# Patient Record
Sex: Female | Born: 1990 | Race: Black or African American | Hispanic: No | Smoking: Never smoker
Health system: Southern US, Community
[De-identification: ages and names within clinical notes are randomized; demographics above are authoritative.]

## PROBLEM LIST (undated history)

## (undated) DIAGNOSIS — N760 Acute vaginitis: Secondary | ICD-10-CM

## (undated) DIAGNOSIS — B373 Candidiasis of vulva and vagina: Secondary | ICD-10-CM

## (undated) DIAGNOSIS — B379 Candidiasis, unspecified: Secondary | ICD-10-CM

## (undated) DIAGNOSIS — R51 Headache: Secondary | ICD-10-CM

## (undated) DIAGNOSIS — N949 Unspecified condition associated with female genital organs and menstrual cycle: Secondary | ICD-10-CM

## (undated) DIAGNOSIS — A6000 Herpesviral infection of urogenital system, unspecified: Secondary | ICD-10-CM

## (undated) DIAGNOSIS — J45909 Unspecified asthma, uncomplicated: Secondary | ICD-10-CM

## (undated) DIAGNOSIS — B9689 Other specified bacterial agents as the cause of diseases classified elsewhere: Secondary | ICD-10-CM

## (undated) HISTORY — PX: NO PAST SURGERIES: SHX2092

## (undated) HISTORY — DX: Unspecified asthma, uncomplicated: J45.909

---
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
## 2009-03-30 ENCOUNTER — Emergency Department (HOSPITAL_COMMUNITY): Admission: EM | Admit: 2009-03-30 | Discharge: 2009-03-30 | Payer: Self-pay | Admitting: Emergency Medicine

## 2009-10-31 ENCOUNTER — Emergency Department (HOSPITAL_COMMUNITY): Admission: EM | Admit: 2009-10-31 | Discharge: 2009-11-01 | Payer: Self-pay | Admitting: Emergency Medicine

## 2010-11-26 ENCOUNTER — Inpatient Hospital Stay (INDEPENDENT_AMBULATORY_CARE_PROVIDER_SITE_OTHER)
Admission: RE | Admit: 2010-11-26 | Discharge: 2010-11-26 | Disposition: A | Discharge status: Home / Self Care | Payer: Self-pay | Source: Ambulatory Visit | Attending: Family Medicine | Admitting: Family Medicine

## 2010-11-26 DIAGNOSIS — H109 Unspecified conjunctivitis: Secondary | ICD-10-CM

## 2011-02-15 LAB — I-STAT 8, (EC8 V) (CONVERTED LAB)
Acid-base deficit: 2
BUN: 4 — ABNORMAL LOW
Bicarbonate: 23.1
HCT: 42
Hemoglobin: 14.3
Operator id: 288831
Sodium: 134 — ABNORMAL LOW
TCO2: 24
pCO2, Ven: 41.6 — ABNORMAL LOW

## 2011-02-15 LAB — URINALYSIS, ROUTINE W REFLEX MICROSCOPIC
Hgb urine dipstick: NEGATIVE
Specific Gravity, Urine: 1.025
Urobilinogen, UA: 1

## 2011-02-23 LAB — CBC
HCT: 33.5 — ABNORMAL LOW
Hemoglobin: 10.9 — ABNORMAL LOW
MCHC: 32.4
MCHC: 32.4
MCV: 73.2 — ABNORMAL LOW
MCV: 73.8 — ABNORMAL LOW
Platelets: 174
RDW: 14.9
RDW: 15.1

## 2012-09-20 ENCOUNTER — Encounter: Payer: Self-pay | Admitting: *Deleted

## 2012-10-12 ENCOUNTER — Encounter: Payer: Self-pay | Admitting: Obstetrics

## 2012-10-12 ENCOUNTER — Ambulatory Visit (INDEPENDENT_AMBULATORY_CARE_PROVIDER_SITE_OTHER): Payer: Medicaid Other | Admitting: *Deleted

## 2012-10-12 VITALS — BP 102/66 | HR 57 | Temp 98.6°F | Ht 67.0 in | Wt 125.2 lb

## 2012-10-12 DIAGNOSIS — Z113 Encounter for screening for infections with a predominantly sexual mode of transmission: Secondary | ICD-10-CM

## 2012-10-12 DIAGNOSIS — K649 Unspecified hemorrhoids: Secondary | ICD-10-CM | POA: Insufficient documentation

## 2012-10-12 DIAGNOSIS — N760 Acute vaginitis: Secondary | ICD-10-CM

## 2012-10-12 MED ORDER — HYDROCORTISONE 2.5 % RE CREA: TOPICAL_CREAM | Freq: Two times a day (BID) | RECTAL | Status: DC

## 2012-10-12 NOTE — Progress Notes (Unsigned)
.   Subjective:     Felicia Castillo is a 22 y.o. female here for a problem exam.  Current complaints:she says that she has a "cyst" around her anal area.  She would also like her A1c level drawn and STD testing, including blood work.  Personal health questionnaire reviewed: {yes/no:9010}.   Gynecologic History No LMP recorded. Patient is not currently having periods (Reason: Oral contraceptives). Contraception: OCP (estrogen/progesterone) Last Pap:10/2011 . Results were: abnormal Last mammogram: N/A  Obstetric History OB History   Grav Para Term Preterm Abortions TAB SAB Ect Mult Living                   {Common ambulatory SmartLinks:19316}  Review of Systems {ros; complete:30496}    Objective:    {exam; complete:18323}    Assessment:    Healthy female exam.    Plan:    {plan:19193}

## 2012-10-13 LAB — WET PREP BY MOLECULAR PROBE: Gardnerella vaginalis: NEGATIVE

## 2012-10-13 LAB — GC/CHLAMYDIA PROBE AMP
CT Probe RNA: NEGATIVE
GC Probe RNA: NEGATIVE

## 2012-11-06 ENCOUNTER — Ambulatory Visit: Payer: Self-pay | Admitting: Obstetrics

## 2012-11-20 ENCOUNTER — Ambulatory Visit: Payer: Medicaid Other | Admitting: Obstetrics

## 2012-11-23 ENCOUNTER — Encounter: Payer: Self-pay | Admitting: Obstetrics

## 2012-11-23 ENCOUNTER — Ambulatory Visit (INDEPENDENT_AMBULATORY_CARE_PROVIDER_SITE_OTHER): Payer: Medicaid Other | Admitting: Obstetrics

## 2012-11-23 VITALS — BP 123/84 | HR 60 | Temp 97.3°F | Ht 67.0 in | Wt 124.0 lb

## 2012-11-23 DIAGNOSIS — Z309 Encounter for contraceptive management, unspecified: Secondary | ICD-10-CM

## 2012-11-23 DIAGNOSIS — Z3202 Encounter for pregnancy test, result negative: Secondary | ICD-10-CM

## 2012-11-23 DIAGNOSIS — Z113 Encounter for screening for infections with a predominantly sexual mode of transmission: Secondary | ICD-10-CM

## 2012-11-23 DIAGNOSIS — N760 Acute vaginitis: Secondary | ICD-10-CM

## 2012-11-23 LAB — RPR

## 2012-11-23 NOTE — Progress Notes (Signed)
Subjective:     Felicia Castillo is a 22 y.o. female here for problem visit.  Current complaints: clear vaginal discharge, spotting, and lower abdominal cramping lasting 2 to 3 days x 2 weeks ago.  Personal health questionnaire reviewed: not asked.   Gynecologic History No LMP recorded. Patient is not currently having periods (Reason: Oral contraceptives). Contraception: OCP (estrogen/progesterone)     The following portions of the patient's history were reviewed and updated as appropriate: allergies, current medications, past family history, past medical history, past social history, past surgical history and problem list.  Review of Systems Pertinent items are noted in HPI.    Objective:    Abdomen: normal findings: soft, non-tender Pelvic: cervix normal in appearance, external genitalia normal and vagina normal without discharge    Assessment:    Healthy female exam.   New partner.  Suspected exposure to STD.   Plan:    Contraception: OCP (estrogen/progesterone). Follow up in: 2 months.   Annual exam.

## 2012-11-24 LAB — HEPATITIS C ANTIBODY: HCV Ab: NEGATIVE

## 2012-11-24 LAB — WET PREP BY MOLECULAR PROBE
Candida species: NEGATIVE
Trichomonas vaginosis: NEGATIVE

## 2012-11-26 LAB — GC/CHLAMYDIA PROBE AMP: CT Probe RNA: POSITIVE — AB

## 2012-11-28 ENCOUNTER — Other Ambulatory Visit: Payer: Self-pay | Admitting: *Deleted

## 2012-11-28 ENCOUNTER — Ambulatory Visit: Payer: Medicaid Other

## 2012-11-28 ENCOUNTER — Ambulatory Visit (INDEPENDENT_AMBULATORY_CARE_PROVIDER_SITE_OTHER): Payer: Medicaid Other | Admitting: *Deleted

## 2012-11-28 VITALS — BP 132/75 | HR 60 | Temp 98.0°F | Wt 124.0 lb

## 2012-11-28 DIAGNOSIS — A64 Unspecified sexually transmitted disease: Secondary | ICD-10-CM

## 2012-11-28 DIAGNOSIS — A749 Chlamydial infection, unspecified: Secondary | ICD-10-CM

## 2012-11-28 MED ORDER — AZITHROMYCIN 250 MG PO TABS: 1000.0000 mg | ORAL_TABLET | Freq: Once | ORAL | Status: DC

## 2012-11-28 MED ORDER — CEFTRIAXONE SODIUM 1 G IJ SOLR
250.0000 mg | Freq: Once | INTRAMUSCULAR | Status: AC
  Administered 2012-11-28: 250 mg via INTRAMUSCULAR

## 2012-11-28 NOTE — Progress Notes (Signed)
Pt here for Rocephin 250 mg injection IM. Ceftriaxone 250 mg given in right GM. Pt tolerated well. Pt to RTO in 3 months for TOC. Pt expressed understanding and appointment scheduled. I explained to pt to abstain from sex at least 2 weeks after she and partner(s) has been treated. Pt expressed understanding.

## 2012-11-28 NOTE — Patient Instructions (Signed)
Return to clinic as directed and as needed.  

## 2012-12-07 ENCOUNTER — Telehealth: Payer: Self-pay | Admitting: *Deleted

## 2012-12-12 ENCOUNTER — Ambulatory Visit: Payer: Medicaid Other | Admitting: Obstetrics

## 2012-12-14 ENCOUNTER — Ambulatory Visit: Payer: Medicaid Other | Admitting: Obstetrics

## 2012-12-19 ENCOUNTER — Ambulatory Visit: Payer: Medicaid Other | Admitting: Obstetrics

## 2012-12-20 ENCOUNTER — Encounter: Payer: Self-pay | Admitting: Obstetrics

## 2012-12-25 NOTE — Telephone Encounter (Signed)
error 

## 2013-01-05 ENCOUNTER — Encounter: Payer: Self-pay | Admitting: Obstetrics & Gynecology

## 2013-01-08 ENCOUNTER — Ambulatory Visit: Payer: Self-pay | Admitting: Obstetrics

## 2013-01-31 ENCOUNTER — Ambulatory Visit: Payer: Self-pay | Admitting: Obstetrics

## 2013-02-23 ENCOUNTER — Encounter: Payer: Self-pay | Admitting: Advanced Practice Midwife

## 2013-02-23 ENCOUNTER — Ambulatory Visit (INDEPENDENT_AMBULATORY_CARE_PROVIDER_SITE_OTHER): Payer: Medicaid Other | Admitting: Advanced Practice Midwife

## 2013-02-23 VITALS — BP 114/77 | HR 61 | Temp 98.3°F | Ht 68.0 in | Wt 121.8 lb

## 2013-02-23 DIAGNOSIS — A64 Unspecified sexually transmitted disease: Secondary | ICD-10-CM

## 2013-02-23 DIAGNOSIS — Z309 Encounter for contraceptive management, unspecified: Secondary | ICD-10-CM

## 2013-02-23 DIAGNOSIS — Z0000 Encounter for general adult medical examination without abnormal findings: Secondary | ICD-10-CM

## 2013-02-23 DIAGNOSIS — Z01419 Encounter for gynecological examination (general) (routine) without abnormal findings: Secondary | ICD-10-CM | POA: Insufficient documentation

## 2013-02-23 DIAGNOSIS — Z3202 Encounter for pregnancy test, result negative: Secondary | ICD-10-CM

## 2013-02-23 DIAGNOSIS — B379 Candidiasis, unspecified: Secondary | ICD-10-CM

## 2013-02-23 HISTORY — DX: Candidiasis, unspecified: B37.9

## 2013-02-23 LAB — POCT URINE PREGNANCY: Preg Test, Ur: NEGATIVE

## 2013-02-23 MED ORDER — NORETHIN ACE-ETH ESTRAD-FE 1-20 MG-MCG PO TABS: 1.0000 | ORAL_TABLET | Freq: Every day | ORAL | Status: DC

## 2013-02-23 MED ORDER — FLUCONAZOLE 150 MG PO TABS: 150.0000 mg | ORAL_TABLET | Freq: Once | ORAL | Status: DC

## 2013-02-23 NOTE — Progress Notes (Signed)
.   Subjective:     Felicia Castillo is a 22 y.o. female here for a problem/annual exam.  Current complaints:patient had an abnormal discharge but since cleared up.  Denies any over the counter medication use.  She requests a pregnancy test.  Personal health questionnaire reviewed: yes.   Gynecologic History Patient's last menstrual period was 02/05/2013. Contraception: OCP (estrogen/progesterone) Last Pap: 11/2011. Results were: normal Last mammogram: N/A  Obstetric History OB History  No data available    The following portions of the patient's history were reviewed and updated as appropriate: allergies, current medications, past family history, past medical history, past social history, past surgical history and problem list.  Review of Systems Pertinent items are noted in HPI.    Objective:    BP 114/77  Pulse 61  Temp(Src) 98.3 F (36.8 C) (Oral)  Ht 5\' 8"  (1.727 m)  Wt 121 lb 12.8 oz (55.248 kg)  BMI 18.52 kg/m2  LMP 02/05/2013  General Appearance:    Alert, cooperative, no distress, appears stated age  Head:    Normocephalic, without obvious abnormality, atraumatic  Eyes:    PERRL, conjunctiva/corneas clear, EOM's intact, fundi    benign, both eyes  Ears:    Normal TM's and external ear canals, both ears  Nose:   Nares normal, septum midline, mucosa normal, no drainage    or sinus tenderness  Throat:   Lips, mucosa, and tongue normal; teeth and gums normal  Neck:   Supple, symmetrical, trachea midline, no adenopathy;    thyroid:  no enlargement/tenderness/nodules; no carotid   bruit or JVD  Back:     Symmetric, no curvature, ROM normal, no CVA tenderness  Lungs:     Clear to auscultation bilaterally, respirations unlabored  Chest Wall:    No tenderness or deformity   Heart:    Regular rate and rhythm, S1 and S2 normal, no murmur, rub   or gallop  Breast Exam:    No tenderness, masses, or nipple abnormality  Abdomen:     Soft, non-tender, bowel sounds active all  four quadrants,    no masses, no organomegaly  Genitalia:    Normal female without lesion, discharge or tenderness  Rectal:    Normal tone, normal prostate, no masses or tenderness;   guaiac negative stool  Extremities:   Extremities normal, atraumatic, no cyanosis or edema  Pulses:   2+ and symmetric all extremities  Skin:   Skin color, texture, turgor normal, no rashes or lesions  Lymph nodes:   Cervical, supraclavicular, and axillary nodes normal  Neurologic:   CNII-XII intact, normal strength, sensation and reflexes    throughout      Assessment:    Healthy female exam.   Yeast infection Well woman exam/Annual  Plan:    Education reviewed: calcium supplements, depression evaluation, low fat, low cholesterol diet, safe sex/STD prevention, self breast exams and smoking cessation. Contraception: oral progesterone-only contraceptive. Follow up in: 1 year.   Pap and STI screening Diflucan 150 mg PO one time  40 min spent with patient greater than 80% spent in counseling and coordination of care.   Harjot Zavadil Wilson Singer CNM

## 2013-02-24 LAB — WET PREP BY MOLECULAR PROBE
Candida species: POSITIVE — AB
Gardnerella vaginalis: NEGATIVE

## 2013-02-24 LAB — HEPATITIS B SURFACE ANTIGEN: Hepatitis B Surface Ag: NEGATIVE

## 2013-02-24 LAB — RPR

## 2013-02-27 LAB — PAP IG W/ RFLX HPV ASCU

## 2013-02-28 ENCOUNTER — Ambulatory Visit: Payer: Medicaid Other | Admitting: Obstetrics

## 2013-03-01 ENCOUNTER — Encounter: Payer: Self-pay | Admitting: Advanced Practice Midwife

## 2013-03-01 DIAGNOSIS — IMO0002 Reserved for concepts with insufficient information to code with codable children: Secondary | ICD-10-CM | POA: Insufficient documentation

## 2013-03-01 DIAGNOSIS — R87612 Low grade squamous intraepithelial lesion on cytologic smear of cervix (LGSIL): Secondary | ICD-10-CM | POA: Insufficient documentation

## 2013-03-28 ENCOUNTER — Other Ambulatory Visit: Payer: Self-pay | Admitting: Advanced Practice Midwife

## 2013-04-04 ENCOUNTER — Ambulatory Visit (INDEPENDENT_AMBULATORY_CARE_PROVIDER_SITE_OTHER): Payer: Medicaid Other | Admitting: Obstetrics

## 2013-04-04 ENCOUNTER — Encounter: Payer: Self-pay | Admitting: Obstetrics

## 2013-04-04 VITALS — BP 126/91 | HR 86 | Temp 98.1°F | Ht 67.0 in | Wt 118.0 lb

## 2013-04-04 DIAGNOSIS — Z7251 High risk heterosexual behavior: Secondary | ICD-10-CM

## 2013-04-04 DIAGNOSIS — R87612 Low grade squamous intraepithelial lesion on cytologic smear of cervix (LGSIL): Secondary | ICD-10-CM | POA: Insufficient documentation

## 2013-04-04 DIAGNOSIS — Z113 Encounter for screening for infections with a predominantly sexual mode of transmission: Secondary | ICD-10-CM

## 2013-04-04 LAB — POCT URINE PREGNANCY: Preg Test, Ur: NEGATIVE

## 2013-04-04 NOTE — Progress Notes (Signed)
Pt stopped taking oral birth control in september, would like to go back to taking loestrin, requests STD testing and pregnancy test.

## 2013-04-04 NOTE — Progress Notes (Signed)
Subjective:     Felicia Castillo is a 22 y.o. female here for a cervical cancer follow up exam.  Current complaints: having cramping in pelvic area, also having headaches.  Personal health questionnaire reviewed: yes.   Gynecologic History Patient's last menstrual period was 03/18/2013. Contraception: none Last Pap: 02/23/2013. Results were: abnormal Last mammogram: N/A  Obstetric History OB History  No data available     The following portions of the patient's history were reviewed and updated as appropriate: allergies, current medications, past family history, past medical history, past social history, past surgical history and problem list.  Review of Systems Pertinent items are noted in HPI.    Objective:    General appearance: alert and no distress Abdomen: normal findings: soft, non-tender Pelvic: cervix normal in appearance, external genitalia normal, no adnexal masses or tenderness and vagina normal without discharge    Assessment:    LGSIL   Plan:    Education reviewed: safe sex/STD prevention. Follow up in: 6 months.

## 2013-04-05 ENCOUNTER — Other Ambulatory Visit: Payer: Self-pay | Admitting: *Deleted

## 2013-04-05 DIAGNOSIS — B9689 Other specified bacterial agents as the cause of diseases classified elsewhere: Secondary | ICD-10-CM

## 2013-04-05 LAB — WET PREP BY MOLECULAR PROBE
Candida species: NEGATIVE
Gardnerella vaginalis: POSITIVE — AB

## 2013-04-05 LAB — GC/CHLAMYDIA PROBE AMP: GC Probe RNA: NEGATIVE

## 2013-04-05 LAB — PAP IG W/ RFLX HPV ASCU

## 2013-04-05 MED ORDER — METRONIDAZOLE 500 MG PO TABS: 500.0000 mg | ORAL_TABLET | Freq: Two times a day (BID) | ORAL | Status: DC

## 2013-04-30 ENCOUNTER — Encounter: Payer: Self-pay | Admitting: Obstetrics

## 2013-05-30 ENCOUNTER — Encounter: Payer: Self-pay | Admitting: Obstetrics

## 2013-06-07 ENCOUNTER — Ambulatory Visit (INDEPENDENT_AMBULATORY_CARE_PROVIDER_SITE_OTHER): Payer: Medicaid Other | Admitting: Obstetrics

## 2013-06-07 ENCOUNTER — Encounter: Payer: Self-pay | Admitting: Obstetrics

## 2013-06-07 VITALS — BP 126/86 | HR 67 | Temp 97.4°F | Ht 67.0 in | Wt 124.0 lb

## 2013-06-07 DIAGNOSIS — N926 Irregular menstruation, unspecified: Secondary | ICD-10-CM

## 2013-06-07 DIAGNOSIS — N939 Abnormal uterine and vaginal bleeding, unspecified: Secondary | ICD-10-CM | POA: Insufficient documentation

## 2013-06-07 DIAGNOSIS — Z113 Encounter for screening for infections with a predominantly sexual mode of transmission: Secondary | ICD-10-CM | POA: Insufficient documentation

## 2013-06-07 DIAGNOSIS — Z7251 High risk heterosexual behavior: Secondary | ICD-10-CM | POA: Insufficient documentation

## 2013-06-07 LAB — POCT URINE PREGNANCY: Preg Test, Ur: NEGATIVE

## 2013-06-07 NOTE — Progress Notes (Signed)
Subjective:    Felicia Castillo is a 23 y.o. female who presents for sexually transmitted disease check. Sexual history reviewed with the patient. STI Exposure: denies knowledge of risky exposure. Previous history of STI trichomonas. Current symptoms abnormal bleeding and clear discharge. Contraception: none Menstrual History: OB History   Grav Para Term Preterm Abortions TAB SAB Ect Mult Living   1 1 1       1       Menarche age: [age] Patient's last menstrual period was 05/22/2013.    The following portions of the patient's history were reviewed and updated as appropriate: allergies, current medications, past family history, past medical history, past social history, past surgical history and problem list.  Review of Systems Pertinent items are noted in HPI.    Objective:    BP 126/86  Pulse 67  Temp(Src) 97.4 F (36.3 C)  Ht 5\' 7"  (1.702 m)  Wt 124 lb (56.246 kg)  BMI 19.42 kg/m2  LMP 05/22/2013 General:   alert and no distress  Lymph Nodes:   Cervical, supraclavicular, and axillary nodes normal.  Pelvis:  Vulva and vagina appear normal. Bimanual exam reveals normal uterus and adnexa.  Cultures:  GC and Chlamydia genprobes and Affirm.     Assessment:    Possible STD exposure    Plan:    Discussed safe sexual practice in detail See orders for STD cultures and assays Will call pt with results RTC PRN

## 2013-06-08 ENCOUNTER — Other Ambulatory Visit: Payer: Self-pay | Admitting: *Deleted

## 2013-06-08 DIAGNOSIS — B9689 Other specified bacterial agents as the cause of diseases classified elsewhere: Secondary | ICD-10-CM

## 2013-06-08 DIAGNOSIS — N760 Acute vaginitis: Principal | ICD-10-CM

## 2013-06-08 LAB — RPR

## 2013-06-08 LAB — GC/CHLAMYDIA PROBE AMP
CT PROBE, AMP APTIMA: NEGATIVE
GC PROBE AMP APTIMA: NEGATIVE

## 2013-06-08 LAB — HEPATITIS B SURFACE ANTIGEN: Hepatitis B Surface Ag: NEGATIVE

## 2013-06-08 LAB — HEPATITIS C ANTIBODY: HCV AB: NEGATIVE

## 2013-06-08 LAB — WET PREP BY MOLECULAR PROBE
Candida species: NEGATIVE
GARDNERELLA VAGINALIS: POSITIVE — AB
TRICHOMONAS VAG: NEGATIVE

## 2013-06-08 LAB — HIV ANTIBODY (ROUTINE TESTING W REFLEX): HIV: NONREACTIVE

## 2013-06-08 MED ORDER — METROGEL-VAGINAL 0.75 % VA GEL: 1.0000 | Freq: Two times a day (BID) | VAGINAL | Status: DC

## 2013-06-13 ENCOUNTER — Encounter: Payer: Self-pay | Admitting: Obstetrics

## 2013-06-15 ENCOUNTER — Encounter: Payer: Self-pay | Admitting: Obstetrics

## 2013-06-15 ENCOUNTER — Other Ambulatory Visit: Payer: Self-pay | Admitting: *Deleted

## 2013-06-15 DIAGNOSIS — B9689 Other specified bacterial agents as the cause of diseases classified elsewhere: Secondary | ICD-10-CM

## 2013-06-15 DIAGNOSIS — N760 Acute vaginitis: Principal | ICD-10-CM

## 2013-06-15 MED ORDER — METRONIDAZOLE 500 MG PO TABS: 500.0000 mg | ORAL_TABLET | Freq: Two times a day (BID) | ORAL | Status: DC

## 2013-06-18 ENCOUNTER — Encounter: Payer: Self-pay | Admitting: Obstetrics & Gynecology

## 2013-07-02 ENCOUNTER — Ambulatory Visit: Payer: Medicaid Other | Admitting: Obstetrics

## 2013-07-06 ENCOUNTER — Encounter: Payer: Self-pay | Admitting: Advanced Practice Midwife

## 2013-07-06 ENCOUNTER — Ambulatory Visit (INDEPENDENT_AMBULATORY_CARE_PROVIDER_SITE_OTHER): Payer: Medicaid Other | Admitting: Advanced Practice Midwife

## 2013-07-06 VITALS — BP 95/57 | HR 55 | Temp 98.1°F | Ht 67.0 in | Wt 123.0 lb

## 2013-07-06 DIAGNOSIS — Z113 Encounter for screening for infections with a predominantly sexual mode of transmission: Secondary | ICD-10-CM

## 2013-07-06 DIAGNOSIS — A499 Bacterial infection, unspecified: Secondary | ICD-10-CM

## 2013-07-06 DIAGNOSIS — B9689 Other specified bacterial agents as the cause of diseases classified elsewhere: Secondary | ICD-10-CM

## 2013-07-06 DIAGNOSIS — N760 Acute vaginitis: Secondary | ICD-10-CM

## 2013-07-06 DIAGNOSIS — Z3202 Encounter for pregnancy test, result negative: Secondary | ICD-10-CM

## 2013-07-06 DIAGNOSIS — Z309 Encounter for contraceptive management, unspecified: Secondary | ICD-10-CM

## 2013-07-06 LAB — POCT URINE PREGNANCY: Preg Test, Ur: NEGATIVE

## 2013-07-06 NOTE — Progress Notes (Signed)
Patient in office today for follow up visit for infection. Patient states she would like a copy of her lab results. Patient denies any itching, burning, irritation, vaginal odor or discharge. Patient denies any concerns.

## 2013-07-06 NOTE — Progress Notes (Signed)
  Subjective:    Felicia Castillo is a 23 y.o. female who presents for sexually transmitted disease check. Sexual history reviewed with the patient. STI Exposure: sexual contact with individual with uncertain background 2 day ago. Current symptoms vaginal discharge: malodorous. Contraception: none  Patient mainly here today because she is uncertain if she has BV again. Reports her symptoms subsided but have seemed to of returned in the past 48 hours. Would like screening for infection as well. Patient reports throwing up one dose of Flagyl w/ last treatment.  Very [facility]. Had recent unprotected IC but also recently had MP.   Menstrual History: OB History   Grav Para Term Preterm Abortions TAB SAB Ect Mult Living   1 1 1       1       Menarche age: 89  Patient's last menstrual period was 06/25/2013.    The following portions of the patient's history were reviewed and updated as appropriate: allergies, current medications, past family history, past medical history, past social history, past surgical history and problem list.  Review of Systems A comprehensive review of systems was negative.    Objective:    BP 95/57  Pulse 55  Temp(Src) 98.1 F (36.7 C)  Ht 5\' 7"  (1.702 m)  Wt 123 lb (55.792 kg)  BMI 19.26 kg/m2  LMP 06/25/2013 General:   alert and cooperative  Lymph Nodes:   Cervical, supraclavicular, and axillary nodes normal.  Pelvis:  Vulva and vagina appear normal. Bimanual exam reveals normal uterus and adnexa.  Cultures:  GC and Chlamydia genprobes and Affirm     Assessment:     GC/CT pending   , Labs pending Undecided on Birth Control Recurrent BV, affirm pending   Plan:    Discussed safe sexual practice in detail  Offered birth control, patient undecided and declined options today. Patient given information and may RTC PRN.  Pt insurance rejected Metrogel however she throws up Flagyl. Will try to have it overridden in the future. Discussed  lifestyle changes to prevent BV.   30 min spent with patient greater than 80% spent in counseling and coordination of care.

## 2013-07-07 LAB — GC/CHLAMYDIA PROBE AMP
CT PROBE, AMP APTIMA: NEGATIVE
GC Probe RNA: NEGATIVE

## 2013-07-07 LAB — WET PREP BY MOLECULAR PROBE
CANDIDA SPECIES: POSITIVE — AB
Gardnerella vaginalis: NEGATIVE
Trichomonas vaginosis: NEGATIVE

## 2013-07-07 LAB — RPR

## 2013-07-07 LAB — HIV ANTIBODY (ROUTINE TESTING W REFLEX): HIV: NONREACTIVE

## 2013-07-07 LAB — HEPATITIS B SURFACE ANTIGEN: Hepatitis B Surface Ag: NEGATIVE

## 2013-07-09 ENCOUNTER — Other Ambulatory Visit: Payer: Self-pay | Admitting: *Deleted

## 2013-07-09 DIAGNOSIS — B379 Candidiasis, unspecified: Secondary | ICD-10-CM

## 2013-07-09 MED ORDER — FLUCONAZOLE 150 MG PO TABS: 150.0000 mg | ORAL_TABLET | Freq: Once | ORAL | Status: DC

## 2013-08-08 ENCOUNTER — Encounter: Payer: Self-pay | Admitting: Obstetrics

## 2013-08-29 ENCOUNTER — Encounter: Payer: Self-pay | Admitting: Obstetrics

## 2013-09-10 ENCOUNTER — Encounter: Payer: Self-pay | Admitting: Obstetrics

## 2013-09-17 ENCOUNTER — Encounter: Payer: Medicaid Other | Admitting: Obstetrics

## 2013-10-03 ENCOUNTER — Encounter: Payer: Self-pay | Admitting: Obstetrics

## 2013-10-03 ENCOUNTER — Ambulatory Visit (INDEPENDENT_AMBULATORY_CARE_PROVIDER_SITE_OTHER): Payer: Medicaid Other | Admitting: Obstetrics

## 2013-10-03 VITALS — Ht 67.0 in | Wt 119.0 lb

## 2013-10-03 DIAGNOSIS — IMO0002 Reserved for concepts with insufficient information to code with codable children: Secondary | ICD-10-CM

## 2013-10-03 DIAGNOSIS — R6889 Other general symptoms and signs: Secondary | ICD-10-CM

## 2013-10-04 ENCOUNTER — Encounter: Payer: Self-pay | Admitting: Obstetrics

## 2013-10-04 DIAGNOSIS — IMO0002 Reserved for concepts with insufficient information to code with codable children: Secondary | ICD-10-CM | POA: Insufficient documentation

## 2013-10-04 NOTE — Progress Notes (Signed)
Pt notified about lab results.  LGSIL.  A/P:  LGSIL.  Schedule colposcopy.          All questions answered to patient's satisfaction.

## 2013-10-10 ENCOUNTER — Ambulatory Visit (INDEPENDENT_AMBULATORY_CARE_PROVIDER_SITE_OTHER): Payer: Medicaid Other | Admitting: Obstetrics

## 2013-10-10 ENCOUNTER — Encounter: Payer: Self-pay | Admitting: Obstetrics

## 2013-10-10 ENCOUNTER — Other Ambulatory Visit: Payer: Self-pay | Admitting: Obstetrics

## 2013-10-10 VITALS — BP 101/65 | HR 53 | Temp 98.2°F | Ht 67.0 in | Wt 121.0 lb

## 2013-10-10 DIAGNOSIS — IMO0002 Reserved for concepts with insufficient information to code with codable children: Secondary | ICD-10-CM

## 2013-10-10 DIAGNOSIS — B9689 Other specified bacterial agents as the cause of diseases classified elsewhere: Secondary | ICD-10-CM | POA: Insufficient documentation

## 2013-10-10 DIAGNOSIS — A499 Bacterial infection, unspecified: Secondary | ICD-10-CM

## 2013-10-10 DIAGNOSIS — Z0000 Encounter for general adult medical examination without abnormal findings: Secondary | ICD-10-CM

## 2013-10-10 DIAGNOSIS — R6889 Other general symptoms and signs: Secondary | ICD-10-CM

## 2013-10-10 DIAGNOSIS — Z01818 Encounter for other preprocedural examination: Secondary | ICD-10-CM

## 2013-10-10 DIAGNOSIS — N760 Acute vaginitis: Secondary | ICD-10-CM

## 2013-10-10 DIAGNOSIS — Z3202 Encounter for pregnancy test, result negative: Secondary | ICD-10-CM

## 2013-10-10 DIAGNOSIS — N949 Unspecified condition associated with female genital organs and menstrual cycle: Secondary | ICD-10-CM

## 2013-10-10 DIAGNOSIS — Z113 Encounter for screening for infections with a predominantly sexual mode of transmission: Secondary | ICD-10-CM

## 2013-10-10 HISTORY — DX: Unspecified condition associated with female genital organs and menstrual cycle: N94.9

## 2013-10-10 LAB — RPR

## 2013-10-10 MED ORDER — CITRANATAL HARMONY 27-1-250 MG PO CAPS: 1.0000 | ORAL_CAPSULE | Freq: Every day | ORAL | Status: DC

## 2013-10-10 MED ORDER — CLINDAMYCIN HCL 300 MG PO CAPS: 300.0000 mg | ORAL_CAPSULE | Freq: Three times a day (TID) | ORAL | Status: DC

## 2013-10-10 NOTE — Progress Notes (Signed)
Colposcopy Procedure Note  Indications: Pap smear 6 months ago showed: low-grade squamous intraepithelial neoplasia (LGSIL - encompassing HPV,mild dysplasia,CIN I). The prior pap showed not asked.  Prior cervical/vaginal disease: normal exam without visible pathology. Prior cervical treatment: no treatment.  Procedure Details  The risks and benefits of the procedure and Written informed consent obtained.  A time-out was performed confirming the patient, procedure and allergy status  Speculum placed in vagina and excellent visualization of cervix achieved, cervix swabbed x 3 with acetic acid solution.  Findings: Cervix: acetowhite lesion(s) noted at 6 o'clock; SCJ visualized 360 degrees without lesions, SCJ visualized - lesion at 6 o'clock, endocervical curettage performed, cervical biopsies taken at 6 and 12 o'clock, specimen labelled and sent to pathology and hemostasis achieved with silver nitrate.   Vaginal inspection: normal without visible lesions. Vulvar colposcopy: vulvar colposcopy not performed.   Physical Exam   Specimens: ECC and Cervical Biopsies  Complications: none.  Plan: Specimens labelled and sent to Pathology. Will base further treatment on Pathology findings. Treatment options discussed with patient. Post biopsy instructions given to patient. Return to discuss Pathology results in 2 weeks.

## 2013-10-10 NOTE — Addendum Note (Signed)
Addended by: Coral CeoHARPER, CHARLES A on: 10/10/2013 05:01 PM   Modules accepted: Orders

## 2013-10-10 NOTE — Addendum Note (Signed)
Addended by: Odessa FlemingBOHNE, Jemar Paulsen M on: 10/10/2013 05:10 PM   Modules accepted: Orders

## 2013-10-11 LAB — HEPATITIS C ANTIBODY: HCV AB: NEGATIVE

## 2013-10-11 LAB — GC/CHLAMYDIA PROBE AMP
CT Probe RNA: NEGATIVE
GC Probe RNA: NEGATIVE

## 2013-10-11 LAB — WET PREP BY MOLECULAR PROBE
Candida species: NEGATIVE
GARDNERELLA VAGINALIS: POSITIVE — AB
Trichomonas vaginosis: NEGATIVE

## 2013-10-11 LAB — HIV ANTIBODY (ROUTINE TESTING W REFLEX): HIV 1&2 Ab, 4th Generation: NONREACTIVE

## 2013-10-11 LAB — HEPATITIS B SURFACE ANTIGEN: Hepatitis B Surface Ag: NEGATIVE

## 2013-10-16 ENCOUNTER — Other Ambulatory Visit: Payer: Self-pay | Admitting: *Deleted

## 2013-10-16 DIAGNOSIS — N760 Acute vaginitis: Principal | ICD-10-CM

## 2013-10-16 DIAGNOSIS — B9689 Other specified bacterial agents as the cause of diseases classified elsewhere: Secondary | ICD-10-CM

## 2013-10-16 MED ORDER — METRONIDAZOLE 500 MG PO TABS: 500.0000 mg | ORAL_TABLET | Freq: Two times a day (BID) | ORAL | Status: DC

## 2013-10-18 ENCOUNTER — Telehealth: Payer: Self-pay | Admitting: *Deleted

## 2013-10-18 NOTE — Telephone Encounter (Signed)
Patient called office and left a message regarding lab results. Attempted to contact patient and left message for patient to contact the office.

## 2013-10-22 LAB — POCT URINE PREGNANCY: Preg Test, Ur: NEGATIVE

## 2013-10-22 NOTE — Addendum Note (Signed)
Addended by: Elby Beck F on: 10/22/2013 12:06 PM   Modules accepted: Orders

## 2013-10-22 NOTE — Telephone Encounter (Signed)
Patient notified-of results- also notified to expect results in mail. Patient states she was treated at appointment. Encouraged patient to keep appointment 6/9 to discuss her pathology with Dr Clearance Coots.

## 2013-10-23 ENCOUNTER — Other Ambulatory Visit: Payer: Self-pay | Admitting: Obstetrics

## 2013-10-23 DIAGNOSIS — N949 Unspecified condition associated with female genital organs and menstrual cycle: Secondary | ICD-10-CM

## 2013-10-26 ENCOUNTER — Other Ambulatory Visit: Payer: Self-pay | Admitting: Advanced Practice Midwife

## 2013-10-30 ENCOUNTER — Encounter: Payer: Self-pay | Admitting: Obstetrics

## 2013-10-30 ENCOUNTER — Ambulatory Visit (INDEPENDENT_AMBULATORY_CARE_PROVIDER_SITE_OTHER): Payer: Medicaid Other | Admitting: Obstetrics

## 2013-10-30 ENCOUNTER — Ambulatory Visit (INDEPENDENT_AMBULATORY_CARE_PROVIDER_SITE_OTHER): Payer: Medicaid Other

## 2013-10-30 VITALS — BP 117/73 | HR 63 | Temp 98.4°F | Ht 67.0 in | Wt 123.0 lb

## 2013-10-30 DIAGNOSIS — N871 Moderate cervical dysplasia: Secondary | ICD-10-CM

## 2013-10-30 DIAGNOSIS — B373 Candidiasis of vulva and vagina: Secondary | ICD-10-CM

## 2013-10-30 DIAGNOSIS — B3731 Acute candidiasis of vulva and vagina: Secondary | ICD-10-CM | POA: Insufficient documentation

## 2013-10-30 DIAGNOSIS — Z0000 Encounter for general adult medical examination without abnormal findings: Secondary | ICD-10-CM

## 2013-10-30 DIAGNOSIS — N949 Unspecified condition associated with female genital organs and menstrual cycle: Secondary | ICD-10-CM

## 2013-10-30 HISTORY — DX: Candidiasis of vulva and vagina: B37.3

## 2013-10-30 HISTORY — DX: Moderate cervical dysplasia: N87.1

## 2013-10-30 HISTORY — DX: Acute candidiasis of vulva and vagina: B37.31

## 2013-10-30 MED ORDER — CITRANATAL HARMONY 27-1-260 MG PO CAPS: ORAL_CAPSULE | ORAL | Status: DC

## 2013-10-30 MED ORDER — AZITHROMYCIN 250 MG PO TABS: ORAL_TABLET | ORAL | Status: DC

## 2013-10-30 MED ORDER — FLUCONAZOLE 150 MG PO TABS: ORAL_TABLET | ORAL | Status: DC

## 2013-10-30 NOTE — Progress Notes (Signed)
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  ECC showed fragments of CIN 1 and normal endocervical glandular tissue.  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  Cryocautery of cervix recommended and agreed to.

## 2013-10-31 LAB — POCT URINALYSIS DIPSTICK
Bilirubin, UA: NEGATIVE
Glucose, UA: NEGATIVE
KETONES UA: NEGATIVE
Leukocytes, UA: NEGATIVE
Nitrite, UA: NEGATIVE
PH UA: 5
RBC UA: 250
Spec Grav, UA: 1.02
UROBILINOGEN UA: NEGATIVE

## 2013-10-31 LAB — WET PREP BY MOLECULAR PROBE
CANDIDA SPECIES: NEGATIVE
GARDNERELLA VAGINALIS: POSITIVE — AB
TRICHOMONAS VAG: NEGATIVE

## 2013-10-31 LAB — URINE CULTURE
COLONY COUNT: NO GROWTH
Organism ID, Bacteria: NO GROWTH

## 2013-10-31 LAB — GC/CHLAMYDIA PROBE AMP
CT PROBE, AMP APTIMA: NEGATIVE
GC Probe RNA: NEGATIVE

## 2013-10-31 NOTE — Addendum Note (Signed)
Addended by: Marya Landry D on: 10/31/2013 09:13 AM   Modules accepted: Orders

## 2013-11-05 ENCOUNTER — Telehealth: Payer: Self-pay | Admitting: *Deleted

## 2013-11-05 NOTE — Telephone Encounter (Signed)
Patient contact office to discuss test results. Advised patient she has BV. Patient states she was given a prescription for Metronidazole but is unable to swallow it. Patient is requesting a prescription for Clindamycin instead.   Patient was also requesting to schedule her cryocautery. Patient states she is currently sexually active and is not using an protection. Patient states her last menstrual cycle was 10-30-13. Patient advised to call on the first day of her next menstrual cycle to schedule appointment. Patient advised it will be scheduled for the week after her cycle. Patient advised to not have unprotected intercourse.

## 2014-01-03 ENCOUNTER — Encounter (HOSPITAL_COMMUNITY): Payer: Self-pay | Admitting: Emergency Medicine

## 2014-01-03 ENCOUNTER — Emergency Department (HOSPITAL_COMMUNITY)
Admission: EM | Admit: 2014-01-03 | Discharge: 2014-01-03 | Disposition: A | Discharge status: Home / Self Care | Payer: Medicaid Other | Source: Home / Self Care | Attending: Family Medicine | Admitting: Family Medicine

## 2014-01-03 ENCOUNTER — Emergency Department (HOSPITAL_COMMUNITY)
Admission: EM | Admit: 2014-01-03 | Discharge: 2014-01-03 | Disposition: A | Discharge status: Home / Self Care | Payer: Medicaid Other | Attending: Emergency Medicine | Admitting: Emergency Medicine

## 2014-01-03 DIAGNOSIS — Z79899 Other long term (current) drug therapy: Secondary | ICD-10-CM | POA: Insufficient documentation

## 2014-01-03 DIAGNOSIS — B9689 Other specified bacterial agents as the cause of diseases classified elsewhere: Secondary | ICD-10-CM | POA: Diagnosis not present

## 2014-01-03 DIAGNOSIS — Z3202 Encounter for pregnancy test, result negative: Secondary | ICD-10-CM | POA: Insufficient documentation

## 2014-01-03 DIAGNOSIS — A499 Bacterial infection, unspecified: Secondary | ICD-10-CM | POA: Insufficient documentation

## 2014-01-03 DIAGNOSIS — Z202 Contact with and (suspected) exposure to infections with a predominantly sexual mode of transmission: Secondary | ICD-10-CM | POA: Diagnosis present

## 2014-01-03 DIAGNOSIS — N926 Irregular menstruation, unspecified: Secondary | ICD-10-CM

## 2014-01-03 DIAGNOSIS — N939 Abnormal uterine and vaginal bleeding, unspecified: Secondary | ICD-10-CM

## 2014-01-03 DIAGNOSIS — J45909 Unspecified asthma, uncomplicated: Secondary | ICD-10-CM | POA: Insufficient documentation

## 2014-01-03 DIAGNOSIS — N760 Acute vaginitis: Secondary | ICD-10-CM | POA: Insufficient documentation

## 2014-01-03 DIAGNOSIS — J069 Acute upper respiratory infection, unspecified: Secondary | ICD-10-CM

## 2014-01-03 LAB — URINALYSIS, ROUTINE W REFLEX MICROSCOPIC
Glucose, UA: NEGATIVE mg/dL
Ketones, ur: 15 mg/dL — AB
Nitrite: NEGATIVE
PROTEIN: NEGATIVE mg/dL
Specific Gravity, Urine: 1.031 — ABNORMAL HIGH (ref 1.005–1.030)
Urobilinogen, UA: 0.2 mg/dL (ref 0.0–1.0)
pH: 5 (ref 5.0–8.0)

## 2014-01-03 LAB — URINE MICROSCOPIC-ADD ON

## 2014-01-03 LAB — POCT URINALYSIS DIP (DEVICE)
GLUCOSE, UA: NEGATIVE mg/dL
Hgb urine dipstick: NEGATIVE
Ketones, ur: NEGATIVE mg/dL
LEUKOCYTES UA: NEGATIVE
Nitrite: NEGATIVE
Protein, ur: NEGATIVE mg/dL
Specific Gravity, Urine: >=1.03 (ref 1.005–1.030)
Urobilinogen, UA: 0.2 mg/dL (ref 0.0–1.0)
pH: 5.5 (ref 5.0–8.0)

## 2014-01-03 LAB — WET PREP, GENITAL
Trich, Wet Prep: NONE SEEN
Yeast Wet Prep HPF POC: NONE SEEN

## 2014-01-03 LAB — POC URINE PREG, ED: Preg Test, Ur: NEGATIVE

## 2014-01-03 LAB — POCT PREGNANCY, URINE: Preg Test, Ur: NEGATIVE

## 2014-01-03 MED ORDER — METRONIDAZOLE 500 MG PO TABS
500.0000 mg | ORAL_TABLET | Freq: Two times a day (BID) | ORAL | Status: DC
Start: 2014-01-03 — End: 2014-01-17

## 2014-01-03 NOTE — Discharge Instructions (Signed)
Continue cold treatment , see health dept if no menstrual in 1 week or for std screening.

## 2014-01-03 NOTE — ED Notes (Signed)
Pt is here to get checked for bacterial vaginosis and stds.  Reports clear milky vaginal discharge and at times LLQ pain.  No pain now

## 2014-01-03 NOTE — ED Provider Notes (Signed)
CSN: [id number]     Arrival date & time 01/03/14  1555 History   First MD Initiated Contact with Patient 01/03/14 1621     Chief Complaint  Patient presents with  . URI  . Possible Pregnancy  . SEXUALLY TRANSMITTED DISEASE   (Consider location/radiation/quality/duration/timing/severity/associated sxs/prior Treatment) Patient is a 23 y.o. female presenting with URI and pregnancy problem. The history is provided by the patient.  URI Presenting symptoms: congestion, cough and rhinorrhea   Presenting symptoms: no fever and no sore throat   Severity:  Mild Onset quality:  Gradual Duration:  3 days Chronicity:  New Relieved by:  None tried Worsened by:  Nothing tried Ineffective treatments:  None tried Associated symptoms: sneezing   Risk factors: sick contacts   Risk factors comment:  Daughter similar sick. Possible Pregnancy Primary symptoms: lmp early july..  Associated symptoms include no fever. Risk factors: no birth control..    Past Medical History  Diagnosis Date  . Asthma     as a child   Past Surgical History  Procedure Laterality Date  . No past surgeries     Family History  Problem Relation Age of Onset  . Depression Mother   . Diabetes Father   . Hypertension Father   . Depression Brother   . Kidney disease Maternal Grandmother   . Kidney disease Paternal Grandmother    History  Substance Use Topics  . Smoking status: Never Smoker   . Smokeless tobacco: Never Used  . Alcohol Use: No   OB History   Grav Para Term Preterm Abortions TAB SAB Ect Mult Living   1 1 1       1      Review of Systems  Constitutional: Negative.  Negative for fever.  HENT: Positive for congestion, rhinorrhea and sneezing. Negative for sore throat.   Respiratory: Positive for cough.   Genitourinary: Negative.     Allergies  Review of patient's allergies indicates no known allergies.  Home Medications   Prior to Admission medications   Medication Sig Start Date End Date  Taking? Authorizing Provider  fluconazole (DIFLUCAN) 150 MG tablet take 1 tablet by mouth 10/30/13   Brock Badharles A Harper, MD  Prenat-FeFmCb-DSS-FA-DHA w/o A [facility](CITRANATAL HARMONY) 27-1-260 MG CAPS Take 1 capsule po daily with breakfast. 10/30/13   Brock Badharles A Harper, MD   BP 114/77  Pulse 64  Temp(Src) 98.7 F (37.1 C) (Oral)  Resp 18  SpO2 99%  LMP 11/26/2013 Physical Exam  Nursing note and vitals reviewed. Constitutional: She is oriented to person, place, and time. She appears well-developed and well-nourished.  HENT:  Mouth/Throat: Oropharynx is clear and moist.  Neck: Normal range of motion. Neck supple.  Pulmonary/Chest: Effort normal and breath sounds normal.  Abdominal: Soft. Bowel sounds are normal.  Lymphadenopathy:    She has no cervical adenopathy.  Neurological: She is alert and oriented to person, place, and time.  Skin: Skin is warm and dry.    ED Course  Procedures (including critical care time) Labs Review Labs Reviewed  POCT URINALYSIS DIP (DEVICE) - Abnormal; Notable for the following:    Bilirubin Urine SMALL (*)    All other components within normal limits  POCT PREGNANCY, URINE    Imaging Review No results found.   MDM   1. URI (upper respiratory infection)   2. Menstrual disorder       Linna HoffJames D Jaequan Propes, MD 01/03/14 [phone]

## 2014-01-03 NOTE — ED Notes (Signed)
C/o cold sx States she has a stuffy nose   States she has vaginal discharge and has hx of BV  Wants pregnancy test

## 2014-01-03 NOTE — Discharge Instructions (Signed)
Take Flagyl as directed until gone. Refer to attached documents for more information. Follow up with your doctor as needed.

## 2014-01-03 NOTE — ED Provider Notes (Signed)
CSN: [id number]     Arrival date & time 01/03/14  1717 History  This chart was scribed for Lolita Patella, working with Toy Cookey, MD found by Elon Spanner, ED Scribe. This patient was seen in room TR11C/TR11C and the patient's care was started at 6:32 PM.     Chief Complaint  Patient presents with  . Exposure to STD   Patient is a 23 y.o. female presenting with STD exposure. The history is provided by the patient. No language interpreter was used.  Exposure to STD This is a recurrent problem. The current episode started 2 days ago. The problem has not changed since onset.Associated symptoms include abdominal pain. Pertinent negatives include no chest pain and no shortness of breath. Nothing aggravates the symptoms. Nothing relieves the symptoms.  Exposure to STD This is a recurrent problem. The current episode started 2 days ago. The problem has not changed since onset.Associated symptoms include abdominal pain. Pertinent negatives include no arthralgias, chest pain, chills, fatigue, fever, nausea, neck pain, vomiting or weakness. Nothing aggravates the symptoms.    HPI Comments: Felicia Castillo is a 23 y.o. female who presents to the Emergency Department complaining of white and milky vaginal discharge onset 3 days ago.  She has a history of BV and is concerned that she may have that.  She reports occasional LLQ pain but denies current pain.  Patient denies any other associated symptoms.  No aggravating or alleviating factors.  Patient stats she has recently had a new sexual partner.     Past Medical History  Diagnosis Date  . Asthma     as a child   Past Surgical History  Procedure Laterality Date  . No past surgeries     Family History  Problem Relation Age of Onset  . Depression Mother   . Diabetes Father   . Hypertension Father   . Depression Brother   . Kidney disease Maternal Grandmother   . Kidney disease Paternal Grandmother    History  Substance Use Topics  .  Smoking status: Never Smoker   . Smokeless tobacco: Never Used  . Alcohol Use: No   OB History   Grav Para Term Preterm Abortions TAB SAB Ect Mult Living   1 1 1       1      Review of Systems  Constitutional: Negative for fever, chills and fatigue.  HENT: Negative for trouble swallowing.   Eyes: Negative for visual disturbance.  Respiratory: Negative for shortness of breath.   Cardiovascular: Negative for chest pain and palpitations.  Gastrointestinal: Positive for abdominal pain. Negative for nausea, vomiting and diarrhea.  Genitourinary: Negative for dysuria and difficulty urinating.  Musculoskeletal: Negative for arthralgias and neck pain.  Skin: Negative for color change.  Neurological: Negative for dizziness and weakness.  Psychiatric/Behavioral: Negative for dysphoric mood.  All other systems reviewed and are negative.     Allergies  Review of patient's allergies indicates no known allergies.  Home Medications   Prior to Admission medications   Medication Sig Start Date End Date Taking? Authorizing Provider  norethindrone-ethinyl estradiol-iron (LOESTRIN FE 1.5/30) 1.5-30 MG-MCG tablet Take 1 tablet by mouth daily.   Yes Historical Provider, MD   BP 133/86  Pulse 53  Temp(Src) 98.2 F (36.8 C) (Oral)  Resp 18  SpO2 100%  LMP 11/26/2013 Physical Exam  Nursing note and vitals reviewed. Constitutional: She is oriented to person, place, and time. She appears well-developed and well-nourished. No distress.  HENT:  Head: Normocephalic  and atraumatic.  Eyes: Conjunctivae and EOM are normal.  Neck: Normal range of motion.  Cardiovascular: Normal rate and regular rhythm.  Exam reveals no gallop and no friction rub.   No murmur heard. Pulmonary/Chest: Effort normal and breath sounds normal. She has no wheezes. She has no rales. She exhibits no tenderness.  Abdominal: Soft. She exhibits no distension. There is no tenderness. There is no rebound and no guarding.   Genitourinary: Vagina normal.  Normal external genitalia. Copious, thick, white vaginal discharge. Cervical os closed. No CMT. No tenderness to palpation on bimanual exam.   Musculoskeletal: Normal range of motion.  Neurological: She is alert and oriented to person, place, and time. Coordination normal.  Speech is goal-oriented. Moves limbs without ataxia.   Skin: Skin is warm and dry.  Psychiatric: She has a normal mood and affect. Her behavior is normal.    ED Course  Procedures (including critical care time)  DIAGNOSTIC STUDIES: Oxygen Saturation is 100% on RA, normal by my interpretation.    COORDINATION OF CARE:  6:33 PM Discussed treatment plan with patient at bedside.  Patient acknowledges and agrees with plan.    Labs Review Labs Reviewed  WET PREP, GENITAL - Abnormal; Notable for the following:    Clue Cells Wet Prep HPF POC FEW (*)    WBC, Wet Prep HPF POC FEW (*)    All other components within normal limits  URINALYSIS, ROUTINE W REFLEX MICROSCOPIC - Abnormal; Notable for the following:    APPearance CLOUDY (*)    Specific Gravity, Urine 1.031 (*)    Hgb urine dipstick SMALL (*)    Bilirubin Urine SMALL (*)    Ketones, ur 15 (*)    Leukocytes, UA TRACE (*)    All other components within normal limits  URINE MICROSCOPIC-ADD ON - Abnormal; Notable for the following:    Squamous Epithelial / LPF FEW (*)    Bacteria, UA FEW (*)    All other components within normal limits  GC/CHLAMYDIA PROBE AMP  POC URINE PREG, ED    Imaging Review No results found.   EKG Interpretation None      MDM   Final diagnoses:  BV (bacterial vaginosis)    Patient's wet prep shows clue cells. Urinalysis unremarkable and urine preg negative. Patient will have Flagyl for BV.   I personally performed the services described in this documentation, which was scribed in my presence. The recorded information has been reviewed and is accurate.     Emilia BeckKaitlyn Karey Suthers,  [place] JerseyPA-C 01/03/14 1938

## 2014-01-04 LAB — GC/CHLAMYDIA PROBE AMP
CT Probe RNA: NEGATIVE
GC Probe RNA: NEGATIVE

## 2014-01-04 NOTE — ED Provider Notes (Signed)
Medical screening examination/treatment/procedure(s) were performed by non-physician practitioner and as supervising physician I was immediately available for consultation/collaboration.  Megan Docherty, MD 01/04/14 2022 

## 2014-01-07 ENCOUNTER — Telehealth (HOSPITAL_BASED_OUTPATIENT_CLINIC_OR_DEPARTMENT_OTHER): Payer: Self-pay | Admitting: Emergency Medicine

## 2014-01-16 ENCOUNTER — Telehealth: Payer: Self-pay | Admitting: *Deleted

## 2014-01-16 NOTE — Telephone Encounter (Signed)
Pt placed call to office stating that she is having vaginal swelling and irritation, burning after urination, increase sensitivity and bumps at her anal area.   Return call to pt making her aware that if she is in severe pain and with symptoms given that she should be seen in the emergency room.  Pt states that she was planning to be seen at hospital.   Pt also advised of last pap results and need for procedure scheduled.  Pt states that she will contact office regarding pap at a later time once current problem seen.

## 2014-01-17 ENCOUNTER — Inpatient Hospital Stay (HOSPITAL_COMMUNITY)
Admission: AD | Admit: 2014-01-17 | Discharge: 2014-01-17 | Disposition: A | Discharge status: Home / Self Care | Payer: Medicaid Other | Source: Ambulatory Visit | Attending: Obstetrics & Gynecology | Admitting: Obstetrics & Gynecology

## 2014-01-17 ENCOUNTER — Encounter (HOSPITAL_COMMUNITY): Payer: Self-pay | Admitting: *Deleted

## 2014-01-17 DIAGNOSIS — B009 Herpesviral infection, unspecified: Secondary | ICD-10-CM

## 2014-01-17 DIAGNOSIS — N949 Unspecified condition associated with female genital organs and menstrual cycle: Secondary | ICD-10-CM | POA: Insufficient documentation

## 2014-01-17 DIAGNOSIS — A6000 Herpesviral infection of urogenital system, unspecified: Secondary | ICD-10-CM | POA: Diagnosis not present

## 2014-01-17 DIAGNOSIS — O98519 Other viral diseases complicating pregnancy, unspecified trimester: Secondary | ICD-10-CM

## 2014-01-17 HISTORY — DX: Headache: R51

## 2014-01-17 LAB — URINALYSIS, ROUTINE W REFLEX MICROSCOPIC
Bilirubin Urine: NEGATIVE
Glucose, UA: NEGATIVE mg/dL
Hgb urine dipstick: NEGATIVE
Ketones, ur: NEGATIVE mg/dL
NITRITE: NEGATIVE
PH: 6 (ref 5.0–8.0)
PROTEIN: NEGATIVE mg/dL
Specific Gravity, Urine: 1.025 (ref 1.005–1.030)
Urobilinogen, UA: 0.2 mg/dL (ref 0.0–1.0)

## 2014-01-17 LAB — URINE MICROSCOPIC-ADD ON

## 2014-01-17 LAB — WET PREP, GENITAL: Trich, Wet Prep: NONE SEEN

## 2014-01-17 LAB — POCT PREGNANCY, URINE: Preg Test, Ur: NEGATIVE

## 2014-01-17 MED ORDER — VALACYCLOVIR HCL 1 G PO TABS: 1000.0000 mg | ORAL_TABLET | Freq: Two times a day (BID) | ORAL | Status: DC

## 2014-01-17 NOTE — MAU Provider Note (Signed)
Attestation of Attending Supervision of Advanced Practitioner (CNM/NP): Evaluation and management procedures were performed by the Advanced Practitioner under my supervision and collaboration. I have reviewed the Advanced Practitioner's note and chart, and I agree with the management and plan.  Donte Lenzo H. 3:22 PM

## 2014-01-17 NOTE — MAU Provider Note (Signed)
  History     CSN: [id number]  Arrival date and time: 01/17/14 1113   None     Chief Complaint  Patient presents with  . Vaginal Pain  . Dysuria   HPI Felicia Castillo 23 y.o. G1P1001 presents to MAU with pain and outbreak.  Outbreak started 8/25.  She has had vaginal swelling and pain particularly with urinating when urine hits the area.  All along the vaginal opening and close to her rectum is very painful and affecting her activities.  She reports she has to be out of here in 15 minutes to pick up her child.  For this reason, history and exam is brief.    OB History   Grav Para Term Preterm Abortions TAB SAB Ect Mult Living   Past Medical History  Diagnosis Date  . Asthma     as a child    Past Surgical History  Procedure Laterality Date  . No past surgeries      Family History  Problem Relation Age of Onset  . Depression Mother   . Diabetes Father   . Hypertension Father   . Depression Brother   . Kidney disease Maternal Grandmother   . Kidney disease Paternal Grandmother     History  Substance Use Topics  . Smoking status: Never Smoker   . Smokeless tobacco: Never Used  . Alcohol Use: No    Allergies: No Known Allergies  Prescriptions prior to admission  Medication Sig Dispense Refill  . metroNIDAZOLE (FLAGYL) 500 MG tablet Take 1 tablet (500 mg total) by mouth 2 (two) times daily.  14 tablet  0  . norethindrone-ethinyl estradiol-iron (LOESTRIN FE 1.5/30) 1.5-30 MG-MCG tablet Take 1 tablet by mouth daily.        ROS No fever, chills, sweats, nausea, vomiting.  Only pain in vagina - worse with urinating.   Physical Exam   Blood pressure 121/89, pulse 64, temperature 98.1 F (36.7 C), temperature source Oral, resp. rate 16, height  (1.727 m), weight 52.617 kg (116 lb), last menstrual period 11/26/2013, SpO2 100.00%.  Physical Exam  Constitutional: She is oriented to person, place, and time. She appears well-developed and  well-nourished. She appears distressed.  HENT:  Head: Normocephalic and atraumatic.  Eyes: EOM are normal.  Neck: Normal range of motion.  Respiratory: Effort normal. No respiratory distress.  GI: Soft. She exhibits no distension. There is no tenderness. There is no rebound and no guarding.  Genitourinary:  External genitalia surrounding introitus and rectum with  approx 15 superficial ulcers with erythematous borders - exquisitely painful.  Viral culture collected.   Vaginal vault with mod amt of thick white malodorous discharge.  No CMT.  Adenxa without tenderness or mass.  Musculoskeletal: Normal range of motion.  Neurological: She is alert and oriented to person, place, and time.  Skin: Skin is warm and dry.  Psychiatric: She has a normal mood and affect.     MAU Course  Procedures Viral culture, wet prep, GC/Chlam collected  MDM Clinical diagnosis of HSV of genitalia  Assessment and Plan  A: Genital HSV  P: Discharge to home Return to MAU if symptoms worsen/for emergency Follow up with [facility] Valtrex 1 gram po bid x 10 days Pending labs - will call with concerning results.    Bertram Denver 01/17/2014, 1:13 PM

## 2014-01-17 NOTE — MAU Note (Signed)
Patient states she has had a rash in the vaginal/anal area since 8-23 that has gotten worse. States it painful when she urinates.

## 2014-01-17 NOTE — Discharge Instructions (Signed)
Herpes Labialis  You have a fever blister or cold sore (herpes labialis). These painful, grouped sores are caused by one of the herpes viruses (HSV1 most commonly). They are usually found around the lips and mouth, but the same infection can also affect other areas on the face such as the nose and eyes. Herpes infections take about 10 days to heal. They often occur again and again in the same spot. Other symptoms may include numbness and tingling in the involved skin, achiness, fever, and swollen glands in the neck. Colds, emotional stress, injuries, or excess sunlight exposure all seem to make herpes reappear. Herpes lip infections are contagious. Direct contact with these sores can spread the infection. It can also be spread to other parts of your own body.  TREATMENT   Herpes labialis is usually self-limited and resolves within 1 week. To reduce pain and swelling, apply ice packs frequently to the sores or suck on popsicles or frozen juice bars. Antiviral medicine may be used by mouth to shorten the duration of the breakout. Avoid spreading the infection by washing your hands often. Be careful not to touch your eyes or genital areas after handling the infected blisters. Do not kiss or have other intimate contact with others. After the blisters are completely healed you may resume contact. Use sunscreen to lessen recurrences.   If this is your first infection with herpes, or if you have a severe or repeated infections, your caregiver may prescribe one of the anti-viral drugs to speed up the healing. If you have sun-related flare-ups despite the use of sunscreen, starting oral anti-viral medicine before a prolonged exposure (going skiing or to the beach) can prevent most episodes.   SEEK IMMEDIATE MEDICAL CARE IF:  · You develop a headache, sleepiness, high fever, vomiting, or severe weakness.  · You have eye irritation, pain, blurred vision or redness.  · You develop a prolonged infection not getting better in 10  days.  Document Released: 05/10/2005 Document Revised: 08/02/2011 Document Reviewed: 03/14/2009  ExitCare® Patient Information ©2015 [employer]. This information is not intended to replace advice given to you by your health care provider. Make sure you discuss any questions you have with your health care provider.

## 2014-01-18 LAB — GC/CHLAMYDIA PROBE AMP
CT Probe RNA: NEGATIVE
GC Probe RNA: NEGATIVE

## 2014-01-21 LAB — HERPES SIMPLEX VIRUS CULTURE: CULTURE: DETECTED

## 2014-01-22 ENCOUNTER — Telehealth (HOSPITAL_COMMUNITY): Payer: Self-pay

## 2014-03-01 ENCOUNTER — Telehealth: Payer: Self-pay | Admitting: *Deleted

## 2014-03-01 NOTE — Telephone Encounter (Signed)
Reviewing message log. Attempted to schedule patient several times for a cryocautery. Attempted to contact patient and left message for patient to contact the office.

## 2014-03-05 NOTE — Telephone Encounter (Signed)
Patient scheduled for Cyro on 03-11-14 @ 4:15p

## 2014-03-11 ENCOUNTER — Encounter: Payer: Self-pay | Admitting: Obstetrics

## 2014-03-25 ENCOUNTER — Encounter (HOSPITAL_COMMUNITY): Payer: Self-pay | Admitting: *Deleted

## 2014-03-26 ENCOUNTER — Emergency Department (HOSPITAL_COMMUNITY)
Admission: EM | Admit: 2014-03-26 | Discharge: 2014-03-26 | Disposition: A | Discharge status: Home / Self Care | Payer: Medicaid Other | Attending: Emergency Medicine | Admitting: Emergency Medicine

## 2014-03-26 ENCOUNTER — Encounter (HOSPITAL_COMMUNITY): Payer: Self-pay

## 2014-03-26 DIAGNOSIS — N898 Other specified noninflammatory disorders of vagina: Secondary | ICD-10-CM

## 2014-03-26 DIAGNOSIS — Z3202 Encounter for pregnancy test, result negative: Secondary | ICD-10-CM | POA: Diagnosis not present

## 2014-03-26 DIAGNOSIS — J45909 Unspecified asthma, uncomplicated: Secondary | ICD-10-CM | POA: Insufficient documentation

## 2014-03-26 DIAGNOSIS — Z79899 Other long term (current) drug therapy: Secondary | ICD-10-CM | POA: Diagnosis not present

## 2014-03-26 DIAGNOSIS — Z202 Contact with and (suspected) exposure to infections with a predominantly sexual mode of transmission: Secondary | ICD-10-CM | POA: Diagnosis present

## 2014-03-26 LAB — PREGNANCY, URINE: PREG TEST UR: NEGATIVE

## 2014-03-26 LAB — URINALYSIS, ROUTINE W REFLEX MICROSCOPIC
Bilirubin Urine: NEGATIVE
GLUCOSE, UA: NEGATIVE mg/dL
Hgb urine dipstick: NEGATIVE
Ketones, ur: NEGATIVE mg/dL
Nitrite: NEGATIVE
Protein, ur: NEGATIVE mg/dL
SPECIFIC GRAVITY, URINE: 1.027 (ref 1.005–1.030)
Urobilinogen, UA: 0.2 mg/dL (ref 0.0–1.0)
pH: 7.5 (ref 5.0–8.0)

## 2014-03-26 LAB — WET PREP, GENITAL
Trich, Wet Prep: NONE SEEN
WBC, Wet Prep HPF POC: NONE SEEN
Yeast Wet Prep HPF POC: NONE SEEN

## 2014-03-26 LAB — URINE MICROSCOPIC-ADD ON

## 2014-03-26 NOTE — ED Provider Notes (Signed)
CSN: [id number]     Arrival date & time 03/26/14  2056 History   First MD Initiated Contact with Patient 03/26/14 2222     Chief Complaint  Patient presents with  . Possible Pregnancy  . Exposure to STD      HPI Patient presents complaining of vaginal discharge of the past several days.  She reports it is thin and liquid-like.  She also requests a pregnancy test.  She reports she had an abnormal Pap smear in the past and has been unable to follow-up with her gynecologist for colposcopy secondary to her work schedule.  She denies abdominal pain.  No fevers or chills.  No nausea or vomiting.  She has a history of herpes and has had her herpes outbreak in the past but reports no herpetic-like lesions now.  No pain with urination.   Past Medical History  Diagnosis Date  . Asthma     as a child  . NFAOZHYQ(657.8Headache(784.0)    Past Surgical History  Procedure Laterality Date  . No past surgeries     Family History  Problem Relation Age of Onset  . Depression Mother   . Diabetes Father   . Hypertension Father   . Depression Brother   . Kidney disease Maternal Grandmother   . Kidney disease Paternal Grandmother    History  Substance Use Topics  . Smoking status: Never Smoker   . Smokeless tobacco: Never Used  . Alcohol Use: No   OB History    Gravida Para Term Preterm AB TAB SAB Ectopic Multiple Living   1 1 1       1      Review of Systems  All other systems reviewed and are negative.     Allergies  Review of patient's allergies indicates no known allergies.  Home Medications   Prior to Admission medications   Medication Sig Start Date End Date Taking? Authorizing Provider  valACYclovir (VALTREX) 1000 MG tablet Take 1 tablet (1,000 mg total) by mouth 2 (two) times daily. 01/17/14   Scot JunKaren E Teague Clark, PA-C   BP 113/90 mmHg  Pulse 66  Temp(Src) 98 F (36.7 C) (Oral)  Resp 16  Ht 5\' 8"  (1.727 m)  Wt 116 lb (52.617 kg)  BMI 17.64 kg/m2  SpO2 99%  LMP  02/26/2014 Physical Exam  Constitutional: She is oriented to person, place, and time. She appears well-developed and well-nourished.  HENT:  Head: Normocephalic.  Eyes: EOM are normal.  Neck: Normal range of motion.  Pulmonary/Chest: Effort normal.  Abdominal: She exhibits no distension. There is no tenderness.  Genitourinary:  Normal external genitalia.  No obvious herpetic lesions noted.  No cervical motion tenderness.  No cervical discharge.  No vaginal bleeding.  No vaginal discharge noted.  No adnexal masses or fullness  Musculoskeletal: Normal range of motion.  Neurological: She is alert and oriented to person, place, and time.  Psychiatric: She has a normal mood and affect.  Nursing note and vitals reviewed.   ED Course  Procedures (including critical care time) Labs Review Labs Reviewed  URINALYSIS, ROUTINE W REFLEX MICROSCOPIC - Abnormal; Notable for the following:    Leukocytes, UA SMALL (*)    All other components within normal limits  URINE MICROSCOPIC-ADD ON - Abnormal; Notable for the following:    Squamous Epithelial / LPF FEW (*)    All other components within normal limits  GC/CHLAMYDIA PROBE AMP  WET PREP, GENITAL  PREGNANCY, URINE    Imaging Review No  results found.   EKG Interpretation None      MDM   Final diagnoses:  Vaginal discharge    Prior to return of the patient's wet prep the patient left the emergency department without informing staff.  Will follow-up with her gonorrhea and chlamydia.  No significant abnormalities noted on pelvic examination.    Lyanne CoKevin M Lulie Hurd, MD 03/26/14 [phone]

## 2014-03-26 NOTE — ED Notes (Signed)
Pt presents requesting a pregnancy test and an evaluation for a possible STD. Pt reports "some vaginal itching."

## 2014-03-26 NOTE — ED Notes (Signed)
Pt stating she needs to leave because she needs to catch the bus.  Dr. Patria Maneampos made aware.  Pt left prior to receiving discharge paperwork.

## 2014-03-27 ENCOUNTER — Telehealth (HOSPITAL_BASED_OUTPATIENT_CLINIC_OR_DEPARTMENT_OTHER): Payer: Self-pay | Admitting: Emergency Medicine

## 2014-03-27 LAB — GC/CHLAMYDIA PROBE AMP
CT PROBE, AMP APTIMA: NEGATIVE
GC Probe RNA: NEGATIVE

## 2014-04-16 ENCOUNTER — Emergency Department (HOSPITAL_COMMUNITY)
Admission: EM | Admit: 2014-04-16 | Discharge: 2014-04-16 | Disposition: A | Discharge status: Home / Self Care | Payer: Medicaid Other | Attending: Emergency Medicine | Admitting: Emergency Medicine

## 2014-04-16 ENCOUNTER — Encounter (HOSPITAL_COMMUNITY): Payer: Self-pay | Admitting: Emergency Medicine

## 2014-04-16 DIAGNOSIS — Z3202 Encounter for pregnancy test, result negative: Secondary | ICD-10-CM | POA: Diagnosis not present

## 2014-04-16 DIAGNOSIS — J45909 Unspecified asthma, uncomplicated: Secondary | ICD-10-CM | POA: Diagnosis not present

## 2014-04-16 DIAGNOSIS — Z8619 Personal history of other infectious and parasitic diseases: Secondary | ICD-10-CM | POA: Diagnosis not present

## 2014-04-16 DIAGNOSIS — N760 Acute vaginitis: Secondary | ICD-10-CM | POA: Diagnosis not present

## 2014-04-16 DIAGNOSIS — B9689 Other specified bacterial agents as the cause of diseases classified elsewhere: Secondary | ICD-10-CM

## 2014-04-16 DIAGNOSIS — N898 Other specified noninflammatory disorders of vagina: Secondary | ICD-10-CM | POA: Diagnosis present

## 2014-04-16 DIAGNOSIS — R350 Frequency of micturition: Secondary | ICD-10-CM | POA: Insufficient documentation

## 2014-04-16 HISTORY — DX: Herpesviral infection of urogenital system, unspecified: A60.00

## 2014-04-16 LAB — WET PREP, GENITAL
Trich, Wet Prep: NONE SEEN
YEAST WET PREP: NONE SEEN

## 2014-04-16 LAB — URINALYSIS, ROUTINE W REFLEX MICROSCOPIC
Bilirubin Urine: NEGATIVE
GLUCOSE, UA: NEGATIVE mg/dL
Hgb urine dipstick: NEGATIVE
Ketones, ur: NEGATIVE mg/dL
LEUKOCYTES UA: NEGATIVE
Nitrite: NEGATIVE
PROTEIN: NEGATIVE mg/dL
Specific Gravity, Urine: 1.027 (ref 1.005–1.030)
Urobilinogen, UA: 0.2 mg/dL (ref 0.0–1.0)
pH: 8 (ref 5.0–8.0)

## 2014-04-16 LAB — POC URINE PREG, ED: Preg Test, Ur: NEGATIVE

## 2014-04-16 MED ORDER — METRONIDAZOLE 500 MG PO TABS
2000.0000 mg | ORAL_TABLET | Freq: Once | ORAL | Status: AC
  Administered 2014-04-16: 2000 mg via ORAL
  Filled 2014-04-16: qty 4

## 2014-04-16 NOTE — ED Notes (Signed)
The patient said she has a "liquid, milky" discharge and has a rash.  She has been diagnosed with the Herpes virus but is not sure if it is Herpes or something else.  The patient said she started feeling the "itchy feeling" a week ago.  Then three or four days ago there was a milky, fluid discharge.   She is not sure what is going on but wants to be checked.

## 2014-04-16 NOTE — Discharge Instructions (Signed)
Bacterial Vaginosis Bacterial vaginosis is a vaginal infection that occurs when the normal balance of bacteria in the vagina is disrupted. It results from an overgrowth of certain bacteria. This is the most common vaginal infection in women of childbearing age. Treatment is important to prevent complications, especially in pregnant women, as it can cause a premature delivery. CAUSES  Bacterial vaginosis is caused by an increase in harmful bacteria that are normally present in smaller amounts in the vagina. Several different kinds of bacteria can cause bacterial vaginosis. However, the reason that the condition develops is not fully understood. RISK FACTORS Certain activities or behaviors can put you at an increased risk of developing bacterial vaginosis, including:  Having a new sex partner or multiple sex partners.  Douching.  Using an intrauterine device (IUD) for contraception. Women do not get bacterial vaginosis from toilet seats, bedding, swimming pools, or contact with objects around them. SIGNS AND SYMPTOMS  Some women with bacterial vaginosis have no signs or symptoms. Common symptoms include:  Grey vaginal discharge.  A fishlike odor with discharge, especially after sexual intercourse.  Itching or burning of the vagina and vulva.  Burning or pain with urination. DIAGNOSIS  Your health care provider will take a medical history and examine the vagina for signs of bacterial vaginosis. A sample of vaginal fluid may be taken. Your health care provider will look at this sample under a microscope to check for bacteria and abnormal cells. A vaginal pH test may also be done.  TREATMENT  Bacterial vaginosis may be treated with antibiotic medicines. These may be given in the form of a pill or a vaginal cream. A second round of antibiotics may be prescribed if the condition comes back after treatment.  HOME CARE INSTRUCTIONS   Only take over-the-counter or prescription medicines as  directed by your health care provider.  If antibiotic medicine was prescribed, take it as directed. Make sure you finish it even if you start to feel better.  Do not have sex until treatment is completed.  Tell all sexual partners that you have a vaginal infection. They should see their health care provider and be treated if they have problems, such as a mild rash or itching.  Practice safe sex by using condoms and only having one sex partner. SEEK MEDICAL CARE IF:   Your symptoms are not improving after 3 days of treatment.  You have increased discharge or pain.  You have a fever. MAKE SURE YOU:   Understand these instructions.  Will watch your condition.  Will get help right away if you are not doing well or get worse. FOR MORE INFORMATION  Centers for Disease Control and Prevention, Division of STD Prevention: www.cdc.gov/std American Sexual Health Association (ASHA): www.ashastd.org  Document Released: 05/10/2005 Document Revised: 02/28/2013 Document Reviewed: 12/20/2012 ExitCare Patient Information 2015 [employer]. This information is not intended to replace advice given to you by your health care provider. Make sure you discuss any questions you have with your health care provider. Bacterial Vaginosis Bacterial vaginosis is a vaginal infection that occurs when the normal balance of bacteria in the vagina is disrupted. It results from an overgrowth of certain bacteria. This is the most common vaginal infection in women of childbearing age. Treatment is important to prevent complications, especially in pregnant women, as it can cause a premature delivery. CAUSES  Bacterial vaginosis is caused by an increase in harmful bacteria that are normally present in smaller amounts in the vagina. Several different kinds of bacteria   can cause bacterial vaginosis. However, the reason that the condition develops is not fully understood. RISK FACTORS Certain activities or behaviors can put  you at an increased risk of developing bacterial vaginosis, including:  Having a new sex partner or multiple sex partners.  Douching.  Using an intrauterine device (IUD) for contraception. Women do not get bacterial vaginosis from toilet seats, bedding, swimming pools, or contact with objects around them. SIGNS AND SYMPTOMS  Some women with bacterial vaginosis have no signs or symptoms. Common symptoms include:  Grey vaginal discharge.  A fishlike odor with discharge, especially after sexual intercourse.  Itching or burning of the vagina and vulva.  Burning or pain with urination. DIAGNOSIS  Your health care provider will take a medical history and examine the vagina for signs of bacterial vaginosis. A sample of vaginal fluid may be taken. Your health care provider will look at this sample under a microscope to check for bacteria and abnormal cells. A vaginal pH test may also be done.  TREATMENT  Bacterial vaginosis may be treated with antibiotic medicines. These may be given in the form of a pill or a vaginal cream. A second round of antibiotics may be prescribed if the condition comes back after treatment.  HOME CARE INSTRUCTIONS   Only take over-the-counter or prescription medicines as directed by your health care provider.  If antibiotic medicine was prescribed, take it as directed. Make sure you finish it even if you start to feel better.  Do not have sex until treatment is completed.  Tell all sexual partners that you have a vaginal infection. They should see their health care provider and be treated if they have problems, such as a mild rash or itching.  Practice safe sex by using condoms and only having one sex partner. SEEK MEDICAL CARE IF:   Your symptoms are not improving after 3 days of treatment.  You have increased discharge or pain.  You have a fever. MAKE SURE YOU:   Understand these instructions.  Will watch your condition.  Will get help right away if  you are not doing well or get worse. FOR MORE INFORMATION  Centers for Disease Control and Prevention, Division of STD Prevention: www.cdc.gov/std American Sexual Health Association (ASHA): www.ashastd.org  Document Released: 05/10/2005 Document Revised: 02/28/2013 Document Reviewed: 12/20/2012 ExitCare Patient Information 2015 [employer]. This information is not intended to replace advice given to you by your health care provider. Make sure you discuss any questions you have with your health care provider.  

## 2014-04-16 NOTE — ED Provider Notes (Signed)
CSN: [id number]     Arrival date & time 04/16/14  1951 History   First MD Initiated Contact with Patient 04/16/14 2034     Chief Complaint  Patient presents with  . Vaginal Discharge    The patient said she has a "liquid, milky" discharge and has a rash.  She has been diagnosed with the Herpes virus but is not sure if it is Herpes or something else.   HPI  Patient is a 23 year old female with past medical history of genital herpes who presents to the emergency room for evaluation of vaginal discharge for the past 4-5 days. Patient states that she last had intercourse approximately one week ago, and 3-4 days after having intercourse she developed a milky, thin, vaginal discharge which was not malodorous. She states that this discharge was there intermittently, and seems to be resolving at this time. She believes that this may be bacterial vaginosis, or may be related to her herpes. She states that she has had some vaginal itching and some tingling sensations. She has not noticed any rash that is visible. She states that sometimes when she wipes after using the restroom she does feel some mild soreness of the skin.  Past Medical History  Diagnosis Date  . Asthma     as a child  . Headache(784.0)   . Herpes genitalis    Past Surgical History  Procedure Laterality Date  . No past surgeries     Family History  Problem Relation Age of Onset  . Depression Mother   . Diabetes Father   . Hypertension Father   . Depression Brother   . Kidney disease Maternal Grandmother   . Kidney disease Paternal Grandmother    History  Substance Use Topics  . Smoking status: Never Smoker   . Smokeless tobacco: Never Used  . Alcohol Use: No   OB History    Gravida Para Term Preterm AB TAB SAB Ectopic Multiple Living   1 1 1       1      Review of Systems  Constitutional: Negative for fever, chills and fatigue.  Respiratory: Negative for chest tightness and shortness of breath.   Cardiovascular:  Negative for chest pain and palpitations.  Gastrointestinal: Negative for nausea, vomiting, abdominal pain, diarrhea and constipation.  Genitourinary: Positive for frequency and vaginal discharge. Negative for dysuria, urgency, hematuria, vaginal bleeding, difficulty urinating and vaginal pain.  All other systems reviewed and are negative.     Allergies  Review of patient's allergies indicates no known allergies.  Home Medications   Prior to Admission medications   Medication Sig Start Date End Date Taking? Authorizing Provider  valACYclovir (VALTREX) 1000 MG tablet Take 1 tablet (1,000 mg total) by mouth 2 (two) times daily. Patient not taking: Reported on 04/16/2014 01/17/14   Scot JunKaren E Teague Clark, PA-C   BP 115/57 mmHg  Pulse 63  Temp(Src) 97.8 F (36.6 C) (Oral)  Resp 16  Ht 5\' 8"  (1.727 m)  Wt 117 lb (53.071 kg)  BMI 17.79 kg/m2  SpO2 100%  LMP 02/27/2014 Physical Exam  Constitutional: She is oriented to person, place, and time. She appears well-developed and well-nourished. No distress.  HENT:  Head: Normocephalic and atraumatic.  Mouth/Throat: Oropharynx is clear and moist. No oropharyngeal exudate.  Eyes: Conjunctivae and EOM are normal. Pupils are equal, round, and reactive to light. No scleral icterus.  Neck: Normal range of motion. Neck supple. No JVD present. No thyromegaly present.  Cardiovascular: Normal rate, regular  rhythm, normal heart sounds and intact distal pulses.  Exam reveals no gallop and no friction rub.   No murmur heard. Pulmonary/Chest: Effort normal and breath sounds normal. No respiratory distress. She has no wheezes. She has no rales. She exhibits no tenderness.  Abdominal: Soft. Bowel sounds are normal. She exhibits no distension and no mass. There is no tenderness. There is no rebound and no guarding.  Genitourinary: Uterus normal. No labial fusion. There is no rash, tenderness, lesion or injury on the right labia. There is no rash, tenderness,  lesion or injury on the left labia. Cervix exhibits no motion tenderness, no discharge and no friability. Right adnexum displays no mass, no tenderness and no fullness. Left adnexum displays no mass, no tenderness and no fullness. No erythema, tenderness or bleeding in the vagina. No foreign body around the vagina. No signs of injury around the vagina. Vaginal discharge found.  No evidence of labial, vaginal, or cervical rash noted. Thin, milky white discharge found in the vaginal vault.  Lymphadenopathy:    She has no cervical adenopathy.  Neurological: She is alert and oriented to person, place, and time.  Skin: Skin is warm and dry. She is not diaphoretic.  Psychiatric: She has a normal mood and affect. Her behavior is normal. Judgment and thought content normal.  Nursing note and vitals reviewed.   ED Course  Procedures (including critical care time) Labs Review Labs Reviewed  WET PREP, GENITAL - Abnormal; Notable for the following:    Clue Cells Wet Prep HPF POC FEW (*)    WBC, Wet Prep HPF POC FEW (*)    All other components within normal limits  URINALYSIS, ROUTINE W REFLEX MICROSCOPIC - Abnormal; Notable for the following:    APPearance CLOUDY (*)    All other components within normal limits  GC/CHLAMYDIA PROBE AMP  POC URINE PREG, ED  POC URINE PREG, ED    Imaging Review No results found.   EKG Interpretation None      MDM   Final diagnoses:  BV (bacterial vaginosis)   Patient is a 23 year old female who presents to emergency room for evaluation of vaginal discharge. Physical exam reveals alert nontoxic-appearing female with minimal amount of thin white vaginal discharge on exam with no visible rash. There is no cervical motion tenderness. Abdomen is soft and nontender with no peritoneal signs at this time. Urine pregnancy is negative. Urinalysis is negative. Wet prep shows few clue cells and few white blood cells. Suspect that this is physiologic discharge versus mild  bacterial vaginosis.  Will give 2 g PO flagyl here. [facility]  Patient to return for worsening abdominal pain, intractable nausea and vomiting, or any other concerning symptoms.  Patient states understanding and agreement at this time.  Patient was discussed with Dr. Ethelda ChickJacubowitz who agrees with the above plan and workup.    Eben Burowourtney A Forcucci, PA-C 04/16/14 2205  Doug SouSam Jacubowitz, MD 04/16/14 [phone]

## 2014-04-17 LAB — GC/CHLAMYDIA PROBE AMP
CT Probe RNA: NEGATIVE
GC PROBE AMP APTIMA: NEGATIVE

## 2014-05-20 ENCOUNTER — Encounter: Payer: Self-pay | Admitting: *Deleted

## 2014-07-11 ENCOUNTER — Telehealth: Payer: Self-pay | Admitting: *Deleted

## 2014-07-11 NOTE — Telephone Encounter (Signed)
Patient is calling to request another pap smear before she commits to cryo. 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 May call patient after 4 and before 8.

## 2014-07-12 NOTE — Telephone Encounter (Signed)
Schedule consult.

## 2014-07-15 NOTE — Telephone Encounter (Signed)
Please call patient to schedule consult with Dr Clearance CootsHarper.

## 2014-09-10 ENCOUNTER — Emergency Department (HOSPITAL_COMMUNITY)
Admission: EM | Admit: 2014-09-10 | Discharge: 2014-09-10 | Discharge status: Against Medical Advice | Payer: Medicaid Other | Attending: Emergency Medicine | Admitting: Emergency Medicine

## 2014-09-10 ENCOUNTER — Encounter (HOSPITAL_COMMUNITY): Payer: Self-pay

## 2014-09-10 DIAGNOSIS — Z202 Contact with and (suspected) exposure to infections with a predominantly sexual mode of transmission: Secondary | ICD-10-CM | POA: Diagnosis present

## 2014-09-10 DIAGNOSIS — J45909 Unspecified asthma, uncomplicated: Secondary | ICD-10-CM | POA: Insufficient documentation

## 2014-09-10 LAB — URINALYSIS, ROUTINE W REFLEX MICROSCOPIC
Bilirubin Urine: NEGATIVE
Glucose, UA: NEGATIVE mg/dL
Hgb urine dipstick: NEGATIVE
KETONES UR: NEGATIVE mg/dL
Leukocytes, UA: NEGATIVE
Nitrite: NEGATIVE
PH: 5 (ref 5.0–8.0)
Protein, ur: NEGATIVE mg/dL
Specific Gravity, Urine: 1.031 — ABNORMAL HIGH (ref 1.005–1.030)
Urobilinogen, UA: 0.2 mg/dL (ref 0.0–1.0)

## 2014-09-10 LAB — PREGNANCY, URINE: PREG TEST UR: NEGATIVE

## 2014-09-10 NOTE — ED Notes (Signed)
Unable to locate for room

## 2014-09-10 NOTE — ED Notes (Addendum)
Pt reports spotting after menstrual cycle ended.  sts this has been going on since last Aug.  reports spotting x 1 wk after cycle ends.  Also requesting STD check.  Pt denies abd pain at this time.  No other c/o voiced.

## 2014-10-02 ENCOUNTER — Ambulatory Visit: Payer: Medicaid Other | Admitting: Obstetrics

## 2014-10-28 ENCOUNTER — Encounter (HOSPITAL_COMMUNITY): Payer: Self-pay

## 2014-10-28 ENCOUNTER — Inpatient Hospital Stay (HOSPITAL_COMMUNITY)
Admission: AD | Admit: 2014-10-28 | Discharge: 2014-10-28 | Disposition: A | Discharge status: Home / Self Care | Payer: Medicaid Other | Source: Ambulatory Visit | Attending: Obstetrics | Admitting: Obstetrics

## 2014-10-28 DIAGNOSIS — A6000 Herpesviral infection of urogenital system, unspecified: Secondary | ICD-10-CM

## 2014-10-28 DIAGNOSIS — A6004 Herpesviral vulvovaginitis: Secondary | ICD-10-CM | POA: Insufficient documentation

## 2014-10-28 LAB — POCT PREGNANCY, URINE: PREG TEST UR: NEGATIVE

## 2014-10-28 LAB — WET PREP, GENITAL
Clue Cells Wet Prep HPF POC: NONE SEEN
TRICH WET PREP: NONE SEEN
YEAST WET PREP: NONE SEEN

## 2014-10-28 NOTE — Discharge Instructions (Signed)
Genital Herpes °Genital herpes is a sexually transmitted disease. This means that it is a disease passed by having sex with an infected person. There is no cure for genital herpes. The time between attacks can be months to years. The virus may live in a person but produce no problems (symptoms). This infection can be passed to a baby as it travels down the birth canal (vagina). In a newborn, this can cause central nervous system damage, eye damage, or even death. The virus that causes genital herpes is usually HSV-2 virus. The virus that causes oral herpes is usually HSV-1. The diagnosis (learning what is wrong) is made through culture results. °SYMPTOMS  °Usually symptoms of pain and itching begin a few days to a week after contact. It first appears as small blisters that progress to small painful ulcers which then scab over and heal after several days. It affects the outer genitalia, birth canal, cervix, penis, anal area, buttocks, and thighs. °HOME CARE INSTRUCTIONS  °· Keep ulcerated areas dry and clean. °· Take medications as directed. Antiviral medications can speed up healing. They will not prevent recurrences or cure this infection. These medications can also be taken for suppression if there are frequent recurrences. °· While the infection is active, it is contagious. Avoid all sexual contact during active infections. °· Condoms may help prevent spread of the herpes virus. °· Practice safe sex. °· Wash your hands thoroughly after touching the genital area. °· Avoid touching your eyes after touching your genital area. °· Inform your caregiver if you have had genital herpes and become pregnant. It is your responsibility to insure a safe outcome for your baby in this pregnancy. °· Only take over-the-counter or prescription medicines for pain, discomfort, or fever as directed by your caregiver. °SEEK MEDICAL CARE IF:  °· You have a recurrence of this infection. °· You do not respond to medications and are not  improving. °· You have new sources of pain or discharge which have changed from the original infection. °· You have an oral temperature above 102° F (38.9° C). °· You develop abdominal pain. °· You develop eye pain or signs of eye infection. °Document Released: 05/07/2000 Document Revised: 08/02/2011 Document Reviewed: 05/28/2009 °Exit[employer]® Patient Information ©2015 [employer]. This information is not intended to replace advice given to you by your health care provider. Make sure you discuss any questions you have with your health care provider. ° °

## 2014-10-28 NOTE — MAU Note (Signed)
Noted a rash in vaginal area 2 days ago.  Hurts when she uses the restroom.  Had something similar last year, was herpes.  Thinks this might be it again.

## 2014-10-28 NOTE — MAU Provider Note (Signed)
History     CSN: [id number]  Arrival date and time: 10/28/14 1300   None     Chief Complaint  Patient presents with  . Rash   HPI Felicia Castillo is 24 y.o. G1P1001 presents for evaluation of a vaginal rash that is painful X 5 days.  She states she used a different soap and thinks this is the cause.  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  She started her period today. 1 partner X 2 yrs.  Trying to conceive.  She is a patient of Dr. Verdell Carmine.    Past Medical History  Diagnosis Date  . Asthma     as a child  . Headache(784.0)   . Herpes genitalis     Past Surgical History  Procedure Laterality Date  . No past surgeries      Family History  Problem Relation Age of Onset  . Depression Mother   . Diabetes Father   . Hypertension Father   . Depression Brother   . Kidney disease Maternal Grandmother   . Kidney disease Paternal Grandmother     History  Substance Use Topics  . Smoking status: Never Smoker   . Smokeless tobacco: Never Used  . Alcohol Use: No    Allergies: No Known Allergies  Prescriptions prior to admission  Medication Sig Dispense Refill Last Dose  . valACYclovir (VALTREX) 1000 MG tablet Take 1 tablet (1,000 mg total) by mouth 2 (two) times daily. (Patient not taking: Reported on 04/16/2014) 20 tablet 0 Not Taking at Unknown time    Review of Systems  Constitutional: Negative for fever and chills.  Gastrointestinal: Positive for abdominal pain (mild menstrual cramping). Negative for nausea and vomiting.  Genitourinary: Negative for dysuria, urgency, frequency and hematuria.       + for vulvar pain  Neurological: Negative for headaches.   Physical Exam   Blood pressure 121/84, pulse 53, temperature 98.2 F (36.8 C), temperature source Oral, resp. rate 18, height  (1.676 m), weight 123 lb (55.792 kg), last menstrual period 10/28/2014.  Physical Exam  Constitutional: She  appears well-developed and well-nourished. No distress.  HENT:  Head: Normocephalic.  Neck: Normal range of motion.  Cardiovascular: Normal rate.   Respiratory: Effort normal.  GI: Soft. She exhibits no distension and no mass. There is no tenderness. There is no rebound and no guarding.  Genitourinary:    There is lesion (there are three lesions that appear to be early stage of HSV outbreak.  No yet ulcerated but very tender to touch.  Neg for redness or  discharge.  ) on the right labia. There is no rash or tenderness on the right labia. There is no rash, tenderness or lesion on the left labia. There is bleeding (first day of menses.  Small amount of bleeding. ) in the vagina. No erythema or tenderness in the vagina. No vaginal discharge found.  Lymphadenopathy:       Right: No inguinal adenopathy present.       Left: No inguinal adenopathy present.    MAU Course  Procedures  MDM Exam Discussed findings with the patient and stressed importance of taking Valtrex (that she has at home) with the first signs of tingling,irritation in the area of concern.  Discussed at length the important of avoiding intercourse with these sxs.  They are not using condoms because they are trying to conceive.  Assessment and Plan  A:  HSV outbreak  P:  Patient has Valtrex at home-will take as directed by Dr. Clearance CootsHarper      She plans follow up with Dr. Clearance CootsHarper.      Make use cool bath, Advil for discomfort  Hurshell Dino,EVE M 10/28/2014, 1:29 PM

## 2014-10-29 LAB — GC/CHLAMYDIA PROBE AMP (~~LOC~~) NOT AT ARMC
Chlamydia: NEGATIVE
Neisseria Gonorrhea: NEGATIVE

## 2014-12-12 ENCOUNTER — Ambulatory Visit: Payer: Medicaid Other | Admitting: Obstetrics

## 2014-12-21 ENCOUNTER — Encounter (HOSPITAL_COMMUNITY): Payer: Self-pay | Admitting: *Deleted

## 2014-12-21 ENCOUNTER — Emergency Department (HOSPITAL_COMMUNITY)
Admission: EM | Admit: 2014-12-21 | Discharge: 2014-12-21 | Disposition: A | Discharge status: Home / Self Care | Payer: Medicaid Other | Attending: Emergency Medicine | Admitting: Emergency Medicine

## 2014-12-21 DIAGNOSIS — B009 Herpesviral infection, unspecified: Secondary | ICD-10-CM

## 2014-12-21 DIAGNOSIS — N898 Other specified noninflammatory disorders of vagina: Secondary | ICD-10-CM

## 2014-12-21 DIAGNOSIS — Z792 Long term (current) use of antibiotics: Secondary | ICD-10-CM | POA: Insufficient documentation

## 2014-12-21 DIAGNOSIS — J45909 Unspecified asthma, uncomplicated: Secondary | ICD-10-CM | POA: Insufficient documentation

## 2014-12-21 LAB — WET PREP, GENITAL
Trich, Wet Prep: NONE SEEN
YEAST WET PREP: NONE SEEN

## 2014-12-21 LAB — URINALYSIS, ROUTINE W REFLEX MICROSCOPIC
Bilirubin Urine: NEGATIVE
GLUCOSE, UA: NEGATIVE mg/dL
HGB URINE DIPSTICK: NEGATIVE
Ketones, ur: NEGATIVE mg/dL
Leukocytes, UA: NEGATIVE
Nitrite: NEGATIVE
PROTEIN: NEGATIVE mg/dL
Specific Gravity, Urine: 1.029 (ref 1.005–1.030)
Urobilinogen, UA: 0.2 mg/dL (ref 0.0–1.0)
pH: 5 (ref 5.0–8.0)

## 2014-12-21 LAB — PREGNANCY, URINE: Preg Test, Ur: NEGATIVE

## 2014-12-21 MED ORDER — LIDOCAINE HCL (PF) 1 % IJ SOLN
5.0000 mL | Freq: Once | INTRAMUSCULAR | Status: AC
  Administered 2014-12-21: 5 mL
  Filled 2014-12-21: qty 5

## 2014-12-21 MED ORDER — CEFTRIAXONE SODIUM 250 MG IJ SOLR
250.0000 mg | Freq: Once | INTRAMUSCULAR | Status: AC
  Administered 2014-12-21: 250 mg via INTRAMUSCULAR
  Filled 2014-12-21: qty 250

## 2014-12-21 MED ORDER — VALACYCLOVIR HCL 500 MG PO TABS: 500.0000 mg | ORAL_TABLET | Freq: Two times a day (BID) | ORAL | Status: DC

## 2014-12-21 MED ORDER — AZITHROMYCIN 250 MG PO TABS
1000.0000 mg | ORAL_TABLET | Freq: Once | ORAL | Status: AC
  Administered 2014-12-21: 1000 mg via ORAL
  Filled 2014-12-21: qty 4

## 2014-12-21 NOTE — Discharge Instructions (Signed)
If you were given medicines take as directed.  If you are on coumadin or contraceptives realize their levels and effectiveness is altered by many different medicines.  If you have any reaction (rash, tongues swelling, other) to the medicines stop taking and see a physician.    If your blood pressure was elevated in the ER make sure you follow up for management with a primary doctor or return for chest pain, shortness of breath or stroke symptoms.  Please follow up as directed and return to the ER or see a physician for new or worsening symptoms.  Thank you. Filed Vitals:   12/21/14 1411 12/21/14 1622  BP: 112/66 116/88  Pulse: 58 52  Temp: 97.8 F (36.6 C)   TempSrc: Oral   Resp: 20 14  SpO2: 99% 100%
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
## 2014-12-21 NOTE — ED Notes (Signed)
Pt states that she has noticed a "rash to genital area". Pt denies vaginal discharge or odor. Pt states that she noticed with after she shaved.

## 2014-12-21 NOTE — ED Notes (Signed)
Declined W/C at D/C and was escorted to lobby by RN. 

## 2014-12-22 LAB — HIV ANTIBODY (ROUTINE TESTING W REFLEX): HIV SCREEN 4TH GENERATION: NONREACTIVE

## 2014-12-23 LAB — GC/CHLAMYDIA PROBE AMP (~~LOC~~) NOT AT ARMC
Chlamydia: NEGATIVE
Neisseria Gonorrhea: NEGATIVE

## 2014-12-30 ENCOUNTER — Telehealth: Payer: Self-pay | Admitting: Obstetrics

## 2015-01-03 NOTE — Telephone Encounter (Signed)
01/03/2015 - Patient still working on Medicaid reactivation. brm

## 2015-01-26 ENCOUNTER — Encounter (HOSPITAL_COMMUNITY): Payer: Self-pay | Admitting: *Deleted

## 2015-01-26 ENCOUNTER — Emergency Department (HOSPITAL_COMMUNITY)
Admission: EM | Admit: 2015-01-26 | Discharge: 2015-01-26 | Disposition: A | Discharge status: Home / Self Care | Payer: Medicaid Other | Attending: Emergency Medicine | Admitting: Emergency Medicine

## 2015-01-26 DIAGNOSIS — N760 Acute vaginitis: Secondary | ICD-10-CM | POA: Insufficient documentation

## 2015-01-26 DIAGNOSIS — Z792 Long term (current) use of antibiotics: Secondary | ICD-10-CM | POA: Diagnosis not present

## 2015-01-26 DIAGNOSIS — A6009 Herpesviral infection of other urogenital tract: Secondary | ICD-10-CM | POA: Insufficient documentation

## 2015-01-26 DIAGNOSIS — A6000 Herpesviral infection of urogenital system, unspecified: Secondary | ICD-10-CM

## 2015-01-26 DIAGNOSIS — Z3202 Encounter for pregnancy test, result negative: Secondary | ICD-10-CM | POA: Insufficient documentation

## 2015-01-26 DIAGNOSIS — J45909 Unspecified asthma, uncomplicated: Secondary | ICD-10-CM | POA: Insufficient documentation

## 2015-01-26 DIAGNOSIS — R21 Rash and other nonspecific skin eruption: Secondary | ICD-10-CM | POA: Diagnosis present

## 2015-01-26 DIAGNOSIS — B9689 Other specified bacterial agents as the cause of diseases classified elsewhere: Secondary | ICD-10-CM

## 2015-01-26 LAB — URINALYSIS, ROUTINE W REFLEX MICROSCOPIC
BILIRUBIN URINE: NEGATIVE
Glucose, UA: NEGATIVE mg/dL
Hgb urine dipstick: NEGATIVE
Ketones, ur: NEGATIVE mg/dL
Leukocytes, UA: NEGATIVE
NITRITE: NEGATIVE
PROTEIN: NEGATIVE mg/dL
SPECIFIC GRAVITY, URINE: 1.026 (ref 1.005–1.030)
UROBILINOGEN UA: 0.2 mg/dL (ref 0.0–1.0)
pH: 6 (ref 5.0–8.0)

## 2015-01-26 LAB — WET PREP, GENITAL
Clue Cells Wet Prep HPF POC: NONE SEEN
Trich, Wet Prep: NONE SEEN
WBC WET PREP: NONE SEEN
YEAST WET PREP: NONE SEEN

## 2015-01-26 LAB — POC URINE PREG, ED: PREG TEST UR: NEGATIVE

## 2015-01-26 MED ORDER — VALACYCLOVIR HCL 1 G PO TABS: 1000.0000 mg | ORAL_TABLET | Freq: Two times a day (BID) | ORAL | Status: AC

## 2015-01-26 MED ORDER — METRONIDAZOLE 0.75 % VA GEL: 1.0000 | Freq: Two times a day (BID) | VAGINAL | Status: DC

## 2015-01-26 NOTE — Discharge Instructions (Signed)
Bacterial Vaginosis                                          Follow-up with your OB/GYN or primary care physician. Use protection with sexual activity. Bacterial vaginosis is a vaginal infection that occurs when the normal balance of bacteria in the vagina is disrupted. It results from an overgrowth of certain bacteria. This is the most common vaginal infection in women of childbearing age. Treatment is important to prevent complications, especially in pregnant women, as it can cause a premature delivery. CAUSES  Bacterial vaginosis is caused by an increase in harmful bacteria that are normally present in smaller amounts in the vagina. Several different kinds of bacteria can cause bacterial vaginosis. However, the reason that the condition develops is not fully understood. RISK FACTORS Certain activities or behaviors can put you at an increased risk of developing bacterial vaginosis, including:  Having a new sex partner or multiple sex partners.  Douching.  Using an intrauterine device (IUD) for contraception. Women do not get bacterial vaginosis from toilet seats, bedding, swimming pools, or contact with objects around them. SIGNS AND SYMPTOMS  Some women with bacterial vaginosis have no signs or symptoms. Common symptoms include:  Grey vaginal discharge.  A fishlike odor with discharge, especially after sexual intercourse.  Itching or burning of the vagina and vulva.  Burning or pain with urination. DIAGNOSIS  Your health care provider will take a medical history and examine the vagina for signs of bacterial vaginosis. A sample of vaginal fluid may be taken. Your health care provider will look at this sample under a microscope to check for bacteria and abnormal cells. A vaginal pH test may also be done.  TREATMENT  Bacterial vaginosis may be treated with antibiotic medicines. These may be given in the form of a pill or  a vaginal cream. A second round of antibiotics may be prescribed if the condition comes back after treatment.  HOME CARE INSTRUCTIONS   Only take over-the-counter or prescription medicines as directed by your health care provider.  If antibiotic medicine was prescribed, take it as directed. Make sure you finish it even if you start to feel better.  Do not have sex until treatment is completed.  Tell all sexual partners that you have a vaginal infection. They should see their health care provider and be treated if they have problems, such as a mild rash or itching.  Practice safe sex by using condoms and only having one sex partner. SEEK MEDICAL CARE IF:   Your symptoms are not improving after 3 days of treatment.  You have increased discharge or pain.  You have a fever. MAKE SURE YOU:   Understand these instructions.  Will watch your condition.  Will get help right away if you are not doing well or get worse. FOR MORE INFORMATION  Centers for Disease Control and Prevention, Division of STD Prevention: [employer]Apps.[employer] American Sexual Health Association (ASHA): www.ashastd.org  Document Released: 05/10/2005 Document Revised: 02/28/2013 Document Reviewed: 12/20/2012 [facility] Patient Information 2015 [place], [place]. This information is not intended to replace advice given to you by your health care provider. Make sure you discuss any questions you have with your health care provider.  Genital Herpes Genital herpes is a sexually transmitted disease. This means that it is a disease passed by having sex with an infected person. There is no cure for genital  herpes. The time between attacks can be months to years. The virus may live in a person but produce no problems (symptoms). This infection can be passed to a baby as it travels down the birth canal (vagina). In a newborn, this can cause central nervous system damage, eye damage, or even death. The virus that causes genital herpes is  usually HSV-2 virus. The virus that causes oral herpes is usually HSV-1. The diagnosis (learning what is wrong) is made through culture results. SYMPTOMS  Usually symptoms of pain and itching begin a few days to a week after contact. It first appears as small blisters that progress to small painful ulcers which then scab over and heal after several days. It affects the outer genitalia, birth canal, cervix, penis, anal area, buttocks, and thighs. HOME CARE INSTRUCTIONS   Keep ulcerated areas dry and clean.  Take medications as directed. Antiviral medications can speed up healing. They will not prevent recurrences or cure this infection. These medications can also be taken for suppression if there are frequent recurrences.  While the infection is active, it is contagious. Avoid all sexual contact during active infections.  Condoms may help prevent spread of the herpes virus.  Practice safe sex.  Wash your hands thoroughly after touching the genital area.  Avoid touching your eyes after touching your genital area.  Inform your caregiver if you have had genital herpes and become pregnant. It is your responsibility to insure a safe outcome for your baby in this pregnancy.  Only take over-the-counter or prescription medicines for pain, discomfort, or fever as directed by your caregiver. SEEK MEDICAL CARE IF:   You have a recurrence of this infection.  You do not respond to medications and are not improving.  You have new sources of pain or discharge which have changed from the original infection.  You have an oral temperature above 102 F (38.9 C).  You develop abdominal pain.  You develop eye pain or signs of eye infection. Document Released: 05/07/2000 Document Revised: 08/02/2011 Document Reviewed: 05/28/2009 [facility] Patient Information 2015 [place], [place]. This information is not intended to replace advice given to you by your health care provider. Make sure you discuss any  questions you have with your health care provider.

## 2015-01-26 NOTE — ED Notes (Addendum)
Pt states she keeps thinking she has bacterial vaginosis, but every time she's checked the Dr says she's fine (2 months ago).  She states she has a rash/bump on her L labia.  She's very concerned about a foul smell in her vagina, though she denies vaginal discharge.  Denies any abdominal pain.

## 2015-01-26 NOTE — ED Provider Notes (Signed)
CSN: [id number]     Arrival date & time 01/26/15  1433 History   First MD Initiated Contact with Patient 01/26/15 1527     Chief Complaint  Patient presents with  . Rash     (Consider location/radiation/quality/duration/timing/severity/associated sxs/prior Treatment) Patient is a 24 y.o. female presenting with rash. The history is provided by the patient. No language interpreter was used.  Rash Associated symptoms: no fever, no nausea and not vomiting    Felicia Castillo is a 24 year old female with a past medical history of genital herpes, asthma, and headaches who presents for STD check. She states she has some bumps on her left labia but she is unaware if this may be caused by her shaving the area or a general herpes outbreak. She has had 2 previous genital herpes outbreaks. She states the vesicles are painful and that she has had foul-smelling odor from her vagina for the past few days but denies any discharge. She denies any fever, chills, double pain, nausea, vomiting, vaginal bleeding, hematuria, dysuria, urinary frequency. Her last menstrual period was 01/09/2015. She states she is only sexually active with one partner. She is requesting to be checked for all STDs.  Past Medical History  Diagnosis Date  . Asthma     as a child  . Headache(784.0)   . Herpes genitalis    Past Surgical History  Procedure Laterality Date  . No past surgeries     Family History  Problem Relation Age of Onset  . Depression Mother   . Diabetes Father   . Hypertension Father   . Depression Brother   . Kidney disease Maternal Grandmother   . Kidney disease Paternal Grandmother    Social History  Substance Use Topics  . Smoking status: Never Smoker   . Smokeless tobacco: Never Used  . Alcohol Use: No   OB History    Gravida Para Term Preterm AB TAB SAB Ectopic Multiple Living   Review of Systems  Constitutional: Negative for fever.  Gastrointestinal: Negative for nausea and  vomiting.  Genitourinary: Negative for hematuria, vaginal bleeding, vaginal discharge, vaginal pain and pelvic pain.  Skin: Positive for rash.  All other systems reviewed and are negative.     Allergies  Review of patient's allergies indicates no known allergies.  Home Medications   Prior to Admission medications   Medication Sig Start Date End Date Taking? Authorizing Provider  ibuprofen (ADVIL,MOTRIN) 200 MG tablet Take 200 mg by mouth every 6 (six) hours as needed for headache, mild pain or moderate pain.   Yes Historical Provider, MD  metroNIDAZOLE (METROGEL) 0.75 % vaginal gel Place 1 Applicatorful vaginally 2 (two) times daily. 01/26/15   Lera Gaines Patel-Mills, PA-C  valACYclovir (VALTREX) 1000 MG tablet Take 1 tablet (1,000 mg total) by mouth 2 (two) times daily. 01/26/15 02/09/15  Savva Beamer Patel-Mills, PA-C   BP 121/59 mmHg  Pulse 58  Temp(Src) 98.2 F (36.8 C) (Oral)  Resp 16  Ht  (1.702 m)  Wt 121 lb 9.6 oz (55.157 kg)  BMI 19.04 kg/m2  SpO2 100%  LMP 01/09/2015 Physical Exam  Constitutional: She is oriented to person, place, and time. She appears well-developed and well-nourished.  HENT:  Head: Normocephalic and atraumatic.  Eyes: Conjunctivae are normal.  Neck: Normal range of motion.  Cardiovascular: Normal rate.   Pulmonary/Chest: Effort normal. No respiratory distress.  Abdominal: Soft. She exhibits no distension. There is no  tenderness. There is no rebound and no guarding.  Genitourinary: Pelvic exam was performed with patient supine. There is no rash, tenderness or lesion on the right labia. There is tenderness and lesion on the left labia. No tenderness or bleeding in the vagina. Vaginal discharge found.  Pelvic exam: Chaperone present. No abdominal or pelvic tenderness to palpation. No vaginal bleeding. Moderate amount of malodorous white thin vaginal discharge. Cervical os closed. Single small erythematous vesicle on the left labia that is tender to palpation. No  active drainage from the vesicle. No adnexal tenderness bilaterally.  Musculoskeletal: Normal range of motion.  Neurological: She is alert and oriented to person, place, and time.  Skin: Skin is warm and dry.  Nursing note and vitals reviewed.   ED Course  Procedures (including critical care time) Labs Review Labs Reviewed  URINALYSIS, ROUTINE W REFLEX MICROSCOPIC (NOT AT [facility]) - Abnormal; Notable for the following:    APPearance TURBID (*)    All other components within normal limits  WET PREP, GENITAL  RPR  HIV ANTIBODY (ROUTINE TESTING)  URINE MICROSCOPIC-ADD ON  POC URINE PREG, ED  GC/CHLAMYDIA PROBE AMP ([place]) NOT AT [facility]    Imaging Review No results found. I have personally reviewed and evaluated these lab results as part of my medical decision-making.   EKG Interpretation None      MDM   Final diagnoses:  Bacterial vaginosis  Genital herpes   Patient presents for STD check and left labia lesion. UA is negative for UTI or Trichomonas. Wet prep is also negative for BV or Trichomonas. Pregnancy negative. Due to patient's symptoms of malodorous discharge, she was given metronidazole. I also prescribed her Valtrex for genital herpes. I discussed return precautions with the patient. I explained that she should follow-up with her PCP and she verbally agrees with the plan. Medications - No data to display STD results pending.    Catha Gosselin, PA-C 01/26/15 1931  Rolland Porter, MD 02/04/15 [phone]

## 2015-01-27 LAB — RPR: RPR Ser Ql: NONREACTIVE

## 2015-01-27 LAB — HIV ANTIBODY (ROUTINE TESTING W REFLEX): HIV SCREEN 4TH GENERATION: NONREACTIVE

## 2015-01-28 LAB — GC/CHLAMYDIA PROBE AMP (~~LOC~~) NOT AT ARMC
Chlamydia: NEGATIVE
Neisseria Gonorrhea: NEGATIVE

## 2015-01-31 ENCOUNTER — Inpatient Hospital Stay (HOSPITAL_COMMUNITY)
Admission: AD | Admit: 2015-01-31 | Discharge: 2015-01-31 | Disposition: A | Discharge status: Home / Self Care | Payer: Medicaid Other | Source: Ambulatory Visit | Attending: Obstetrics | Admitting: Obstetrics

## 2015-01-31 ENCOUNTER — Encounter (HOSPITAL_COMMUNITY): Payer: Self-pay | Admitting: *Deleted

## 2015-01-31 DIAGNOSIS — N939 Abnormal uterine and vaginal bleeding, unspecified: Secondary | ICD-10-CM | POA: Insufficient documentation

## 2015-01-31 LAB — CBC
HCT: 34.6 % — ABNORMAL LOW (ref 36.0–46.0)
Hemoglobin: 11.2 g/dL — ABNORMAL LOW (ref 12.0–15.0)
MCH: 23.5 pg — ABNORMAL LOW (ref 26.0–34.0)
MCHC: 32.4 g/dL (ref 30.0–36.0)
MCV: 72.7 fL — AB (ref 78.0–100.0)
PLATELETS: 214 10*3/uL (ref 150–400)
RBC: 4.76 MIL/uL (ref 3.87–5.11)
RDW: 15.8 % — AB (ref 11.5–15.5)
WBC: 4.5 10*3/uL (ref 4.0–10.5)

## 2015-01-31 LAB — URINALYSIS, ROUTINE W REFLEX MICROSCOPIC
BILIRUBIN URINE: NEGATIVE
Glucose, UA: NEGATIVE mg/dL
KETONES UR: 15 mg/dL — AB
LEUKOCYTES UA: NEGATIVE
NITRITE: NEGATIVE
PH: 5.5 (ref 5.0–8.0)
PROTEIN: 30 mg/dL — AB
Specific Gravity, Urine: >1.03 — ABNORMAL HIGH (ref 1.005–1.030)
UROBILINOGEN UA: 0.2 mg/dL (ref 0.0–1.0)

## 2015-01-31 LAB — URINE MICROSCOPIC-ADD ON

## 2015-01-31 LAB — POCT PREGNANCY, URINE: PREG TEST UR: NEGATIVE

## 2015-01-31 NOTE — Discharge Instructions (Signed)
Abnormal Uterine Bleeding Abnormal uterine bleeding can affect women at various stages in life, including teenagers, women in their reproductive years, pregnant women, and women who have reached menopause. Several kinds of uterine bleeding are considered abnormal, including:  Bleeding or spotting between periods.   Bleeding after sexual intercourse.   Bleeding that is heavier or more than normal.   Periods that last longer than usual.  Bleeding after menopause.  Many cases of abnormal uterine bleeding are minor and simple to treat, while others are more serious. Any type of abnormal bleeding should be evaluated by your health care provider. Treatment will depend on the cause of the bleeding. HOME CARE INSTRUCTIONS Monitor your condition for any changes. The following actions may help to alleviate any discomfort you are experiencing:  Avoid the use of tampons and douches as directed by your health care provider.  Change your pads frequently. You should get regular pelvic exams and Pap tests. Keep all follow-up appointments for diagnostic tests as directed by your health care provider.  SEEK MEDICAL CARE IF:   Your bleeding lasts more than 1 week.   You feel dizzy at times.  SEEK IMMEDIATE MEDICAL CARE IF:   You pass out.   You are changing pads every 15 to 30 minutes.   You have abdominal pain.  You have a fever.   You become sweaty or weak.   You are passing large blood clots from the vagina.   You start to feel nauseous and vomit. MAKE SURE YOU:   Understand these instructions.  Will watch your condition.  Will get help right away if you are not doing well or get worse. Document Released: 05/10/2005 Document Revised: 05/15/2013 Document Reviewed: 12/07/2012 ExitCare Patient Information 2015 [employer]. This information is not intended to replace advice given to you by your health care provider. Make sure you discuss any questions you have with your  health care provider.  

## 2015-01-31 NOTE — MAU Note (Signed)
Heavy bleeding x1 day, pad change q 1 hour this am, states cramping in lower abd.

## 2015-01-31 NOTE — MAU Provider Note (Signed)
Chief Complaint: Vaginal Bleeding   First Provider Initiated Contact with Patient 01/31/15 1204      SUBJECTIVE HPI: Felicia Castillo is a 24 y.o. G1P1001 who presents to maternity admissions reporting onset of heavy vaginal bleeding yesterday.  She reports changing her pad every hour this morning and each pad is 1/2 soaked.  Her LMP was 2 weeks ago and she reports her cycles are usually regular but with occasional spotting in between.  She has been trying off and on x 2 years to become pregnant but has not. She took OCPs last year for a short time and wonders if that could still be affecting her periods.  She denies abdominal pain, vaginal bleeding, vaginal itching/burning, urinary symptoms, h/a, dizziness, n/v, or fever/chills.     Vaginal Bleeding The patient's primary symptoms include vaginal bleeding. The patient's pertinent negatives include no pelvic pain or vaginal discharge. This is a new problem. The current episode started yesterday. The problem occurs constantly. The problem has been unchanged. The patient is experiencing no pain. She is not pregnant. Pertinent negatives include no abdominal pain, back pain, chills, constipation, diarrhea, dysuria, fever, flank pain, frequency, headaches, nausea, urgency or vomiting. The vaginal bleeding is heavier than menses. She has not been passing clots. She has not been passing tissue. She has tried nothing for the symptoms. She is sexually active. No, her partner does not have an STD.    Past Medical History  Diagnosis Date  . Asthma     as a child  . Headache(784.0)   . Herpes genitalis    Past Surgical History  Procedure Laterality Date  . No past surgeries     Social History   Social History  . Marital Status: Single    Spouse Name: N/A  . Number of Children: 1  . Years of Education: N/A   Occupational History  . Not on file.   Social History Main Topics  . Smoking status: Never Smoker   . Smokeless tobacco: Never Used  .  Alcohol Use: No  . Drug Use: No  . Sexual Activity:    Partners: Male    Birth Control/ Protection: None, Condom     Comment: Last encounter 10-03-13   Other Topics Concern  . Not on file   Social History Narrative   No current facility-administered medications on file prior to encounter.   Current Outpatient Prescriptions on File Prior to Encounter  Medication Sig Dispense Refill  . ibuprofen (ADVIL,MOTRIN) 200 MG tablet Take 200 mg by mouth every 6 (six) hours as needed for headache, mild pain or moderate pain.    . metroNIDAZOLE (METROGEL) 0.75 % vaginal gel Place 1 Applicatorful vaginally 2 (two) times daily. (Patient not taking: Reported on 01/31/2015) 70 g 0  . valACYclovir (VALTREX) 1000 MG tablet Take 1 tablet (1,000 mg total) by mouth 2 (two) times daily. (Patient taking differently: Take 1,000 mg by mouth 2 (two) times daily as needed (for outbreaks). ) 20 tablet 0   No Known Allergies  ROS:  Review of Systems  Constitutional: Negative for fever, chills and fatigue.  HENT: Negative for sinus pressure.   Eyes: Negative for photophobia.  Respiratory: Negative for shortness of breath.   Cardiovascular: Negative for chest pain.  Gastrointestinal: Negative for nausea, vomiting, abdominal pain, diarrhea and constipation.  Genitourinary: Positive for vaginal bleeding. Negative for dysuria, urgency, frequency, flank pain, vaginal discharge, difficulty urinating, vaginal pain and pelvic pain.  Musculoskeletal: Negative for back pain and neck pain.  Neurological: Negative for dizziness, weakness and headaches.  Psychiatric/Behavioral: Negative.      I have reviewed patient's Past Medical Hx, Surgical Hx, Family Hx, Social Hx, medications and allergies.   Physical Exam  Patient Vitals for the past 24 hrs:  BP Temp Temp src Pulse Resp SpO2  01/31/15 1054 127/60 mmHg 97.9 F (36.6 C) Oral (!) 56 18 100 %   Constitutional: Well-developed, well-nourished female in no acute  distress.  Cardiovascular: normal rate Respiratory: normal effort GI: Abd soft, non-tender. Pos BS x 4 MS: Extremities nontender, no edema, normal ROM Neurologic: Alert and oriented x 4.  GU: Neg CVAT.  PELVIC EXAM: Cervix pink, visually closed, without lesion, moderate amount dark red bleeding, vaginal walls and external genitalia normal Bimanual exam: Cervix 0/long/high, firm, anterior, neg CMT, uterus nontender, nonenlarged, adnexa without tenderness, enlargement, or mass   LAB RESULTS Results for orders placed or performed during the hospital encounter of 01/31/15 (from the past 24 hour(s))  Urinalysis, Routine w reflex microscopic (not at [facility])     Status: Abnormal   Collection Time: 01/31/15 11:06 AM  Result Value Ref Range   Color, Urine YELLOW YELLOW   APPearance HAZY (A) CLEAR   Specific Gravity, Urine >1.030 (H) 1.005 - 1.030   pH 5.5 5.0 - 8.0   Glucose, UA NEGATIVE NEGATIVE mg/dL   Hgb urine dipstick LARGE (A) NEGATIVE   Bilirubin Urine NEGATIVE NEGATIVE   Ketones, ur 15 (A) NEGATIVE mg/dL   Protein, ur 30 (A) NEGATIVE mg/dL   Urobilinogen, UA 0.2 0.0 - 1.0 mg/dL   Nitrite NEGATIVE NEGATIVE   Leukocytes, UA NEGATIVE NEGATIVE  Urine microscopic-add on     Status: None   Collection Time: 01/31/15 11:06 AM  Result Value Ref Range   Squamous Epithelial / LPF RARE RARE   RBC / HPF 11-20 <3 RBC/hpf  Pregnancy, urine POC     Status: None   Collection Time: 01/31/15 11:09 AM  Result Value Ref Range   Preg Test, Ur NEGATIVE NEGATIVE  CBC     Status: Abnormal   Collection Time: 01/31/15 11:24 AM  Result Value Ref Range   WBC 4.5 4.0 - 10.5 K/uL   RBC 4.76 3.87 - 5.11 MIL/uL   Hemoglobin 11.2 (L) 12.0 - 15.0 g/dL   HCT 16.1 (L) 09.6 - 04.5 %   MCV 72.7 (L) 78.0 - 100.0 fL   MCH 23.5 (L) 26.0 - 34.0 pg   MCHC 32.4 30.0 - 36.0 g/dL   RDW 40.9 (H) 81.1 - 91.4 %   Platelets 214 150 - 400 K/uL       IMAGING No results found.  MAU Management/MDM: Ordered labs and  reviewed results. Discussed normal menstrual cycle with pt.  This is likely anovulatory cycle.  She may follow up with her provider if periods continue to be abnormal.  Pt stable at time of discharge.  ASSESSMENT 1. Abnormal uterine bleeding (AUB)     PLAN Discharge home with bleeding precautions F/U with Dr Clearance Coots Return to MAU as needed for emergencies    Medication List    STOP taking these medications        metroNIDAZOLE 0.75 % vaginal gel  Commonly known as:  METROGEL      TAKE these medications        ibuprofen 200 MG tablet  Commonly known as:  ADVIL,MOTRIN  Take 200 mg by mouth every 6 (six) hours as needed for headache, mild pain or moderate pain.  valACYclovir 1000 MG tablet  Commonly known as:  VALTREX  Take 1 tablet (1,000 mg total) by mouth 2 (two) times daily.           Follow-up Information    Follow up with HARPER,CHARLES A, MD.   Specialty:  Obstetrics and Gynecology   Why:  As needed   Contact information:   [address] [place] [address] [phone]       Follow up with [facility] [place] MATERNITY ADMISSIONS.   Why:  As needed for emergencies   Contact information:   [address] [phone] mc [place] [address] [phone]      Sharen Counter Certified Nurse-Midwife 01/31/2015  12:24 PM

## 2015-06-23 ENCOUNTER — Emergency Department (HOSPITAL_COMMUNITY)
Admission: EM | Admit: 2015-06-23 | Discharge: 2015-06-23 | Disposition: A | Discharge status: Home / Self Care | Payer: Medicaid Other | Attending: Emergency Medicine | Admitting: Emergency Medicine

## 2015-06-23 ENCOUNTER — Encounter (HOSPITAL_COMMUNITY): Payer: Self-pay | Admitting: Emergency Medicine

## 2015-06-23 DIAGNOSIS — B9689 Other specified bacterial agents as the cause of diseases classified elsewhere: Secondary | ICD-10-CM

## 2015-06-23 DIAGNOSIS — Z3202 Encounter for pregnancy test, result negative: Secondary | ICD-10-CM | POA: Insufficient documentation

## 2015-06-23 DIAGNOSIS — N760 Acute vaginitis: Secondary | ICD-10-CM | POA: Diagnosis not present

## 2015-06-23 DIAGNOSIS — Z8619 Personal history of other infectious and parasitic diseases: Secondary | ICD-10-CM | POA: Diagnosis not present

## 2015-06-23 DIAGNOSIS — N898 Other specified noninflammatory disorders of vagina: Secondary | ICD-10-CM | POA: Diagnosis present

## 2015-06-23 DIAGNOSIS — J45909 Unspecified asthma, uncomplicated: Secondary | ICD-10-CM | POA: Diagnosis not present

## 2015-06-23 LAB — URINALYSIS, ROUTINE W REFLEX MICROSCOPIC
Bilirubin Urine: NEGATIVE
Glucose, UA: NEGATIVE mg/dL
Hgb urine dipstick: NEGATIVE
KETONES UR: NEGATIVE mg/dL
NITRITE: NEGATIVE
PH: 5 (ref 5.0–8.0)
PROTEIN: NEGATIVE mg/dL
Specific Gravity, Urine: 1.027 (ref 1.005–1.030)

## 2015-06-23 LAB — WET PREP, GENITAL
Sperm: NONE SEEN
Trich, Wet Prep: NONE SEEN
Yeast Wet Prep HPF POC: NONE SEEN

## 2015-06-23 LAB — URINE MICROSCOPIC-ADD ON

## 2015-06-23 LAB — POC URINE PREG, ED: Preg Test, Ur: NEGATIVE

## 2015-06-23 MED ORDER — METRONIDAZOLE 500 MG PO TABS: 500.0000 mg | ORAL_TABLET | Freq: Two times a day (BID) | ORAL | Status: DC

## 2015-06-23 NOTE — ED Notes (Signed)
Patient up ambulatory to the bathroom at this time per [place], NT

## 2015-06-23 NOTE — ED Notes (Signed)
Pelvic cart at bedside and set up.  

## 2015-06-23 NOTE — ED Provider Notes (Signed)
CSN: [id number]     Arrival date & time 06/23/15  1017 History   First MD Initiated Contact with Patient 06/23/15 1033     Chief Complaint  Patient presents with  . Vaginal Discharge   (Consider location/radiation/quality/duration/timing/severity/associated sxs/prior Treatment) HPI 25 y.o. female presents to the Emergency Department today complaining of vaginal itching/discharge since Saturday. Pt noticed a white discharge coming from her vagina since Saturday. Notes sexual intercourse on Friday with use of a condom. No dysuria currently. No vaginal bleeding. No abdominal pain. No fevers. LMP- 05/27/15.  Past Medical History  Diagnosis Date  . Asthma     as a child  . Headache(784.0)   . Herpes genitalis    Past Surgical History  Procedure Laterality Date  . No past surgeries     Family History  Problem Relation Age of Onset  . Depression Mother   . Diabetes Father   . Hypertension Father   . Depression Brother   . Kidney disease Maternal Grandmother   . Kidney disease Paternal Grandmother    Social History  Substance Use Topics  . Smoking status: Never Smoker   . Smokeless tobacco: Never Used  . Alcohol Use: No   OB History    Gravida Para Term Preterm AB TAB SAB Ectopic Multiple Living   Review of Systems ROS reviewed and all are negative for acute change except as noted in the HPI.  Allergies  Review of patient's allergies indicates no known allergies.  Home Medications   Prior to Admission medications   Medication Sig Start Date End Date Taking? Authorizing Provider  ibuprofen (ADVIL,MOTRIN) 200 MG tablet Take 200 mg by mouth every 6 (six) hours as needed for headache, mild pain or moderate pain.    Historical Provider, MD   BP 119/84 mmHg  Pulse 56  Temp(Src) 97.7 F (36.5 C) (Oral)  Resp 18  SpO2 100%   Physical Exam  Constitutional: She is oriented to person, place, and time. She appears well-developed and well-nourished.  HENT:   Head: Normocephalic and atraumatic.  Eyes: EOM are normal.  Cardiovascular: Normal rate, regular rhythm and normal heart sounds.   Pulmonary/Chest: Effort normal and breath sounds normal.  Abdominal: Soft. There is no tenderness.  Genitourinary: Vagina normal and uterus normal. Pelvic exam was performed with patient supine. Cervix exhibits discharge (white). Cervix exhibits no motion tenderness. Right adnexum displays no tenderness. Left adnexum displays tenderness. No erythema, tenderness or bleeding in the vagina. No foreign body around the vagina.  Musculoskeletal: Normal range of motion.  Neurological: She is alert and oriented to person, place, and time.  Skin: Skin is warm and dry.  Psychiatric: She has a normal mood and affect. Her behavior is normal. Thought content normal.  Nursing note and vitals reviewed.  Exam performed by Eston Esters,  exam chaperoned Date: 06/23/2015 Pelvic exam: normal external genitalia without evidence of trauma. VULVA: normal appearing vulva with no masses, tenderness or lesion. VAGINA: normal appearing vagina with normal color and discharge, no lesions. CERVIX: normal appearing cervix without lesions, cervical motion tenderness absent, cervical os closed with out purulent discharge; vaginal discharge - white and curd-like, Wet prep and DNA probe for chlamydia and GC obtained.   ADNEXA: normal adnexa in size, nontender and no masses UTERUS: uterus is normal size, shape, consistency and nontender.   ED Course  Procedures (including critical care time) Labs Review Labs Reviewed  WET PREP, GENITAL - Abnormal; Notable for the following:    Clue Cells Wet Prep HPF POC PRESENT (*)    WBC, Wet Prep HPF POC MANY (*)    All other components within normal limits  URINALYSIS, ROUTINE W REFLEX MICROSCOPIC (NOT AT [facility]) - Abnormal; Notable for the following:    APPearance CLOUDY (*)    Leukocytes, UA SMALL (*)    All other components within normal limits   URINE MICROSCOPIC-ADD ON - Abnormal; Notable for the following:    Squamous Epithelial / LPF 6-30 (*)    Bacteria, UA RARE (*)    All other components within normal limits  POC URINE PREG, ED  GC/CHLAMYDIA PROBE AMP ([place]) NOT AT [facility]   Imaging Review No results found. I have personally reviewed and evaluated these images and lab results as part of my medical decision-making.   EKG Interpretation None      MDM  I have reviewed relevant laboratory values. I have reviewed the relevant previous healthcare records.I obtained HPI from historian. Patient discussed with supervising physician  ED Course:  Assessment: 25y F presents with vaginal discharge x2 days. Notes sexual intercourse on Friday with protection. Pt worried about infection. Pelvic exam showed white curd-like discharge. Will test GC, Wet Prep. Afebrile. Wet prep showed Clue Cells. Will treat with Flagyl and have her follow up with PCP for further management.  Patient is in no acute distress. Vital Signs are stable. Patient is able to ambulate. Patient able to tolerate PO.   Disposition/Plan:  DC Home Additional Verbal discharge instructions given and discussed with patient.  Pt Instructed to f/u with PCP in the next 48 hours for evaluation and treatment of symptoms. Return precautions given Pt acknowledges and agrees with plan   Supervising Physician Benjiman Core, MD   Final diagnoses:  BV (bacterial vaginosis)      Audry Pili, PA-C 06/23/15 1138  Benjiman Core, MD 06/24/15 [phone]

## 2015-06-23 NOTE — ED Notes (Signed)
Pt sts white vaginal discharge and irritation x 2 days; pt sts LMP was 05/27/15

## 2015-06-23 NOTE — Discharge Instructions (Signed)
Please read and follow all provided instructions.  Your diagnoses today include:  1. BV (bacterial vaginosis)     Tests performed today include:  Vital signs. See below for your results today.   Medications prescribed:  Flagyl. Take as Prescribed   Home care instructions:  Follow any educational materials contained in this packet.  Follow-up instructions: Please follow-up with your primary care provider in the next 48 hours for further evaluation of symptoms and treatment   Return instructions:   Please return to the Emergency Department if you do not get better, if you get worse, or new symptoms OR  - Fever (temperature greater than 101.108F)  - Bleeding that does not stop with holding pressure to the area    -Severe pain (please note that you may be more sore the day after your accident)  - Chest Pain  - Difficulty breathing  - Severe nausea or vomiting  - Inability to tolerate food and liquids  - Passing out  - Skin becoming red around your wounds  - Change in mental status (confusion or lethargy)  - New numbness or weakness     Please return if you have any other emergent concerns.  Additional Information:  Your vital signs today were: BP 119/84 mmHg   Pulse 56   Temp(Src) 97.7 F (36.5 C) (Oral)   Resp 18   SpO2 100% If your blood pressure (BP) was elevated above 135/85 this visit, please have this repeated by your doctor within one month. ---------------

## 2015-06-24 LAB — GC/CHLAMYDIA PROBE AMP (~~LOC~~) NOT AT ARMC
CHLAMYDIA, DNA PROBE: NEGATIVE
NEISSERIA GONORRHEA: NEGATIVE

## 2015-06-27 ENCOUNTER — Telehealth (HOSPITAL_COMMUNITY): Payer: Self-pay

## 2015-06-27 NOTE — Telephone Encounter (Signed)
Pt calling for STD results.  ID verified x 2.  Pt informed both Gonorrhea and Chlamydia are negative. 

## 2015-07-28 ENCOUNTER — Encounter (HOSPITAL_COMMUNITY): Payer: Self-pay | Admitting: Emergency Medicine

## 2015-07-28 ENCOUNTER — Emergency Department (HOSPITAL_COMMUNITY)
Admission: EM | Admit: 2015-07-28 | Discharge: 2015-07-28 | Disposition: A | Discharge status: Home / Self Care | Payer: Medicaid Other | Attending: Emergency Medicine | Admitting: Emergency Medicine

## 2015-07-28 DIAGNOSIS — B373 Candidiasis of vulva and vagina: Secondary | ICD-10-CM | POA: Insufficient documentation

## 2015-07-28 DIAGNOSIS — Z3202 Encounter for pregnancy test, result negative: Secondary | ICD-10-CM | POA: Diagnosis not present

## 2015-07-28 DIAGNOSIS — N760 Acute vaginitis: Secondary | ICD-10-CM | POA: Diagnosis not present

## 2015-07-28 DIAGNOSIS — N898 Other specified noninflammatory disorders of vagina: Secondary | ICD-10-CM | POA: Diagnosis present

## 2015-07-28 DIAGNOSIS — J45909 Unspecified asthma, uncomplicated: Secondary | ICD-10-CM | POA: Insufficient documentation

## 2015-07-28 DIAGNOSIS — B9689 Other specified bacterial agents as the cause of diseases classified elsewhere: Secondary | ICD-10-CM

## 2015-07-28 DIAGNOSIS — B3731 Acute candidiasis of vulva and vagina: Secondary | ICD-10-CM

## 2015-07-28 HISTORY — DX: Other specified bacterial agents as the cause of diseases classified elsewhere: N76.0

## 2015-07-28 HISTORY — DX: Other specified bacterial agents as the cause of diseases classified elsewhere: B96.89

## 2015-07-28 LAB — BASIC METABOLIC PANEL
ANION GAP: 10 (ref 5–15)
BUN: 12 mg/dL (ref 6–20)
CHLORIDE: 103 mmol/L (ref 101–111)
CO2: 25 mmol/L (ref 22–32)
Calcium: 9.8 mg/dL (ref 8.9–10.3)
Creatinine, Ser: 0.73 mg/dL (ref 0.44–1.00)
GFR calc Af Amer: >60 mL/min (ref >60)
GLUCOSE: 85 mg/dL (ref 65–99)
POTASSIUM: 4.2 mmol/L (ref 3.5–5.1)
Sodium: 138 mmol/L (ref 135–145)

## 2015-07-28 LAB — URINALYSIS, ROUTINE W REFLEX MICROSCOPIC
Bilirubin Urine: NEGATIVE
GLUCOSE, UA: NEGATIVE mg/dL
HGB URINE DIPSTICK: NEGATIVE
Ketones, ur: NEGATIVE mg/dL
LEUKOCYTES UA: NEGATIVE
Nitrite: NEGATIVE
PROTEIN: NEGATIVE mg/dL
SPECIFIC GRAVITY, URINE: 1.025 (ref 1.005–1.030)
pH: 7.5 (ref 5.0–8.0)

## 2015-07-28 LAB — CBC WITH DIFFERENTIAL/PLATELET
BASOS ABS: 0 10*3/uL (ref 0.0–0.1)
Basophils Relative: 0 %
Eosinophils Absolute: 0.1 10*3/uL (ref 0.0–0.7)
Eosinophils Relative: 2 %
HCT: 33.3 % — ABNORMAL LOW (ref 36.0–46.0)
HEMOGLOBIN: 11 g/dL — AB (ref 12.0–15.0)
LYMPHS ABS: 2.7 10*3/uL (ref 0.7–4.0)
LYMPHS PCT: 37 %
MCH: 23.2 pg — AB (ref 26.0–34.0)
MCHC: 33 g/dL (ref 30.0–36.0)
MCV: 70.1 fL — AB (ref 78.0–100.0)
Monocytes Absolute: 0.5 10*3/uL (ref 0.1–1.0)
Monocytes Relative: 6 %
NEUTROS ABS: 4 10*3/uL (ref 1.7–7.7)
NEUTROS PCT: 54 %
PLATELETS: 231 10*3/uL (ref 150–400)
RBC: 4.75 MIL/uL (ref 3.87–5.11)
RDW: 14.5 % (ref 11.5–15.5)
WBC: 7.4 10*3/uL (ref 4.0–10.5)

## 2015-07-28 LAB — WET PREP, GENITAL: Trich, Wet Prep: NONE SEEN

## 2015-07-28 LAB — POC URINE PREG, ED: PREG TEST UR: NEGATIVE

## 2015-07-28 MED ORDER — FLUCONAZOLE 100 MG PO TABS
150.0000 mg | ORAL_TABLET | Freq: Once | ORAL | Status: AC
  Administered 2015-07-28: 150 mg via ORAL
  Filled 2015-07-28: qty 2

## 2015-07-28 MED ORDER — METRONIDAZOLE 500 MG PO TABS: 500.0000 mg | ORAL_TABLET | Freq: Two times a day (BID) | ORAL | Status: DC

## 2015-07-28 NOTE — ED Provider Notes (Signed)
CSN: [id number]     Arrival date & time 07/28/15  1843 History  By signing my name below, I, Felicia Castillo, attest that this documentation has been prepared under the direction and in the presence of [address], [place]. Electronically Signed: Phillis Castillo, ED Scribe. 07/28/2015. 1:34 AM.   Chief Complaint  Patient presents with  . Vaginal Discharge   Patient is a 25 y.o. female presenting with vaginal discharge. The history is provided by the patient. No language interpreter was used.  Vaginal Discharge Quality:  Milky Severity:  Moderate Onset quality:  Gradual Duration:  3 days Timing:  Constant Progression:  Worsening Chronicity:  New Ineffective treatments:  None tried Associated symptoms: dysuria and vaginal itching   Associated symptoms: no fever, no nausea and no vomiting   HPI Comments: Felicia Castillo is a [name] with a hx of bacterial vaginosis and herpes genitalis who presents to the Emergency Department complaining of vaginal irritation and discharge onset one 3 days ago. Pt reports that her menstrual period was supposed to come 07/25/15 and it is late. She reports associated dysuria earlier today. She denies fever, chills, abdominal pain, nausea, vomiting, or vaginal bleeding. She has been pregnant once and delivered to term once.  Past Medical History  Diagnosis Date  . Asthma     as a child  . Headache(784.0)   . Herpes genitalis   . Bacterial vaginosis    Past Surgical History  Procedure Laterality Date  . No past surgeries     Family History  Problem Relation Age of Onset  . Depression Mother   . Diabetes Father   . Hypertension Father   . Depression Brother   . Kidney disease Maternal Grandmother   . Kidney disease Paternal Grandmother    Social History  Substance Use Topics  . Smoking status: Never Smoker   . Smokeless tobacco: Never Used  . Alcohol Use: No   OB History    Gravida Para Term Preterm AB TAB SAB Ectopic Multiple Living   Review of Systems  Constitutional: Negative for fever.  Gastrointestinal: Negative for nausea and vomiting.  Genitourinary: Positive for dysuria and vaginal discharge.  All other systems reviewed and are negative.  Allergies  Review of patient's allergies indicates no known allergies.  Home Medications   Prior to Admission medications   Medication Sig Start Date End Date Taking? Authorizing Provider  ibuprofen (ADVIL,MOTRIN) 200 MG tablet Take 200 mg by mouth every 6 (six) hours as needed for headache, mild pain or moderate pain.    Historical Provider, MD  metroNIDAZOLE (FLAGYL) 500 MG tablet Take 1 tablet (500 mg total) by mouth 2 (two) times daily. 07/28/15   Malachy Coleman Orlene Och, NP   BP 117/74 mmHg  Pulse 63  Temp(Src) 98.1 F (36.7 C) (Oral)  Resp 16  SpO2 100%  LMP 06/27/2015 Physical Exam  Constitutional: She is oriented to person, place, and time. She appears well-developed and well-nourished.  HENT:  Head: Normocephalic and atraumatic.  Eyes: EOM are normal.  Neck: Normal range of motion. Neck supple.  Cardiovascular: Normal rate, regular rhythm and normal heart sounds.  Exam reveals no gallop and no friction rub.   No murmur heard. Pulmonary/Chest: Effort normal and breath sounds normal. She has no wheezes.  Abdominal: Soft. There is no tenderness.  Genitourinary:  External genitalia without lesions, white d/c vaginal vault, no CMT, no adnexal  tenderness, uterus without palpable enlargement.   Musculoskeletal: Normal range of motion.  Neurological: She is alert and oriented to person, place, and time.  Skin: Skin is warm and dry.  Psychiatric: She has a normal mood and affect. Her behavior is normal.  Nursing note and vitals reviewed.   ED Course  Procedures (including critical care time) Diflucan 150 mg PO  DIAGNOSTIC STUDIES: Oxygen Saturation is 99% on RA, normal by my interpretation.    COORDINATION OF CARE: 8:25 PM-Discussed treatment plan  which includes labs and pelvic exam with pt at bedside and pt agreed to plan.    Labs Review Results for orders placed or performed during the hospital encounter of 07/28/15 (from the past 24 hour(s))  CBC with Differential     Status: Abnormal   Collection Time: 07/28/15  8:24 PM  Result Value Ref Range   WBC 7.4 4.0 - 10.5 K/uL   RBC 4.75 3.87 - 5.11 MIL/uL   Hemoglobin 11.0 (L) 12.0 - 15.0 g/dL   HCT 60.433.3 (L) 54.036.0 - 98.146.0 %   MCV 70.1 (L) 78.0 - 100.0 fL   MCH 23.2 (L) 26.0 - 34.0 pg   MCHC 33.0 30.0 - 36.0 g/dL   RDW 19.114.5 47.811.5 - 29.515.5 %   Platelets 231 150 - 400 K/uL   Neutrophils Relative % 54 %   Neutro Abs 4.0 1.7 - 7.7 K/uL   Lymphocytes Relative 37 %   Lymphs Abs 2.7 0.7 - 4.0 K/uL   Monocytes Relative 6 %   Monocytes Absolute 0.5 0.1 - 1.0 K/uL   Eosinophils Relative 2 %   Eosinophils Absolute 0.1 0.0 - 0.7 K/uL   Basophils Relative 0 %   Basophils Absolute 0.0 0.0 - 0.1 K/uL  Basic metabolic panel     Status: None   Collection Time: 07/28/15  8:24 PM  Result Value Ref Range   Sodium 138 135 - 145 mmol/L   Potassium 4.2 3.5 - 5.1 mmol/L   Chloride 103 101 - 111 mmol/L   CO2 25 22 - 32 mmol/L   Glucose, Bld 85 65 - 99 mg/dL   BUN 12 6 - 20 mg/dL   Creatinine, Ser 6.210.73 0.44 - 1.00 mg/dL   Calcium 9.8 8.9 - 30.810.3 mg/dL   GFR calc non Af Amer >60 >60 mL/min   GFR calc Af Amer >60 >60 mL/min   Anion gap 10 5 - 15  POC Urine Pregnancy, ED (do NOT order at [facility])     Status: None   Collection Time: 07/28/15  8:49 PM  Result Value Ref Range   Preg Test, Ur NEGATIVE NEGATIVE  Urinalysis, Routine w reflex microscopic (not at [facility])     Status: None   Collection Time: 07/28/15  8:55 PM  Result Value Ref Range   Color, Urine YELLOW YELLOW   APPearance CLEAR CLEAR   Specific Gravity, Urine 1.025 1.005 - 1.030   pH 7.5 5.0 - 8.0   Glucose, UA NEGATIVE NEGATIVE mg/dL   Hgb urine dipstick NEGATIVE NEGATIVE   Bilirubin Urine NEGATIVE NEGATIVE   Ketones, ur NEGATIVE NEGATIVE  mg/dL   Protein, ur NEGATIVE NEGATIVE mg/dL   Nitrite NEGATIVE NEGATIVE   Leukocytes, UA NEGATIVE NEGATIVE  Wet prep, genital     Status: Abnormal   Collection Time: 07/28/15  9:48 PM  Result Value Ref Range   Yeast Wet Prep HPF POC PRESENT (A) NONE SEEN   Trich, Wet Prep NONE SEEN NONE SEEN   Clue Cells  Wet Prep HPF POC PRESENT (A) NONE SEEN   WBC, Wet Prep HPF POC MANY (A) NONE SEEN   Sperm PRESENT      Imaging Review No results found. I have personally reviewed and evaluated the lab results as part of my medical decision-making.   MDM  25 y.o. female with vaginal d/c and itching x 1 week stable for d/c without fever, no acute abdomen. Will treat for BV and yeast and patient to f/u with her GYN. Discussed with the patient clinical and lab findings and plan of care. All questioned fully answered.    Final diagnoses:  Monilial vaginitis  Bacterial vaginosis    I personally performed the services described in this documentation, which was scribed in my presence. The recorded information has been reviewed and is accurate.      [address] [place], [place] 07/29/15 9604  Rolan Bucco, MD 07/30/15 1640

## 2015-07-28 NOTE — Discharge Instructions (Signed)

## 2015-07-28 NOTE — ED Notes (Signed)
Pt. requesting STD screening , reports vaginal irritation with vaginal discharge onset last week . Pt. states history of vaginosis .

## 2015-07-29 LAB — GC/CHLAMYDIA PROBE AMP (~~LOC~~) NOT AT ARMC
Chlamydia: NEGATIVE
Neisseria Gonorrhea: NEGATIVE

## 2015-07-29 LAB — RPR: RPR: NONREACTIVE

## 2015-07-29 LAB — HIV ANTIBODY (ROUTINE TESTING W REFLEX): HIV SCREEN 4TH GENERATION: NONREACTIVE

## 2015-09-11 ENCOUNTER — Emergency Department (HOSPITAL_COMMUNITY)
Admission: EM | Admit: 2015-09-11 | Discharge: 2015-09-11 | Disposition: A | Discharge status: Home / Self Care | Payer: Medicaid Other | Attending: Emergency Medicine | Admitting: Emergency Medicine

## 2015-09-11 ENCOUNTER — Encounter (HOSPITAL_COMMUNITY): Payer: Self-pay | Admitting: Emergency Medicine

## 2015-09-11 DIAGNOSIS — K0381 Cracked tooth: Secondary | ICD-10-CM | POA: Insufficient documentation

## 2015-09-11 DIAGNOSIS — Z8619 Personal history of other infectious and parasitic diseases: Secondary | ICD-10-CM | POA: Diagnosis not present

## 2015-09-11 DIAGNOSIS — Z8742 Personal history of other diseases of the female genital tract: Secondary | ICD-10-CM | POA: Diagnosis not present

## 2015-09-11 DIAGNOSIS — J45909 Unspecified asthma, uncomplicated: Secondary | ICD-10-CM | POA: Insufficient documentation

## 2015-09-11 DIAGNOSIS — Z792 Long term (current) use of antibiotics: Secondary | ICD-10-CM | POA: Diagnosis not present

## 2015-09-11 DIAGNOSIS — K029 Dental caries, unspecified: Secondary | ICD-10-CM | POA: Diagnosis not present

## 2015-09-11 DIAGNOSIS — K047 Periapical abscess without sinus: Secondary | ICD-10-CM | POA: Diagnosis not present

## 2015-09-11 DIAGNOSIS — K0889 Other specified disorders of teeth and supporting structures: Secondary | ICD-10-CM | POA: Diagnosis present

## 2015-09-11 MED ORDER — NAPROXEN 375 MG PO TABS: 375.0000 mg | ORAL_TABLET | Freq: Two times a day (BID) | ORAL | Status: DC

## 2015-09-11 MED ORDER — AMOXICILLIN 500 MG PO CAPS: 500.0000 mg | ORAL_CAPSULE | Freq: Three times a day (TID) | ORAL | Status: DC

## 2015-09-11 MED ORDER — IBUPROFEN 400 MG PO TABS
600.0000 mg | ORAL_TABLET | Freq: Once | ORAL | Status: AC
  Administered 2015-09-11: 600 mg via ORAL
  Filled 2015-09-11: qty 1

## 2015-09-11 MED ORDER — AMOXICILLIN 500 MG PO CAPS
500.0000 mg | ORAL_CAPSULE | Freq: Once | ORAL | Status: AC
  Administered 2015-09-11: 500 mg via ORAL
  Filled 2015-09-11: qty 1

## 2015-09-11 NOTE — ED Provider Notes (Signed)
CSN: [id number]     Arrival date & time 09/11/15  2055 History  By signing my name below, I, Felicia Castillo, attest that this documentation has been prepared under the direction and in the presence of [facility] Britten Seyfried NP. Electronically Signed: Bethel BornBritney Castillo, ED Scribe. 09/11/2015 9:42 PM    Chief Complaint  Patient presents with  . Dental Problem    The history is provided by the patient. No language interpreter was used.   Felicia Castillo is a 25 y.o. female who presents to the Emergency Department complaining of worsening, aching, 8/10 in severity, left lower dental pain and swelling with onset 3 days ago. She used Tylenol for pain yesterday but nothing today.  Pt states that the swelling has improved since initial onset. Pt denies fever, cough, congestion, and difficulty swallowing. She had an appointment with her dentist today but had to reschedule for next week due to work. NKDA.   Past Medical History  Diagnosis Date  . Asthma     as a child  . Headache(784.0)   . Herpes genitalis   . Bacterial vaginosis    Past Surgical History  Procedure Laterality Date  . No past surgeries     Family History  Problem Relation Age of Onset  . Depression Mother   . Diabetes Father   . Hypertension Father   . Depression Brother   . Kidney disease Maternal Grandmother   . Kidney disease Paternal Grandmother    Social History  Substance Use Topics  . Smoking status: Never Smoker   . Smokeless tobacco: Never Used  . Alcohol Use: No   OB History    Gravida Para Term Preterm AB TAB SAB Ectopic Multiple Living   1 1 1       1      Review of Systems  Constitutional: Negative for fever.  HENT: Positive for dental problem and facial swelling. Negative for congestion, drooling and trouble swallowing.   Respiratory: Negative for cough.   All other systems reviewed and are negative.   Allergies  Review of patient's allergies indicates no known allergies.  Home Medications   Prior  to Admission medications   Medication Sig Start Date End Date Taking? Authorizing Provider  amoxicillin (AMOXIL) 500 MG capsule Take 1 capsule (500 mg total) by mouth 3 (three) times daily. 09/11/15   Antwion Carpenter Orlene OchM Valaree Fresquez, NP  ibuprofen (ADVIL,MOTRIN) 200 MG tablet Take 200 mg by mouth every 6 (six) hours as needed for headache, mild pain or moderate pain.    Historical Provider, MD  metroNIDAZOLE (FLAGYL) 500 MG tablet Take 1 tablet (500 mg total) by mouth 2 (two) times daily. 07/28/15   Tarisa Paola Orlene OchM Dyon Rotert, NP  naproxen (NAPROSYN) 375 MG tablet Take 1 tablet (375 mg total) by mouth 2 (two) times daily. 09/11/15   Rainie Crenshaw Orlene OchM Tolbert Matheson, NP   BP 141/75 mmHg  Pulse 71  Temp(Src) 98.1 F (36.7 C) (Oral)  Resp 18  Ht 5\' 7"  (1.702 m)  Wt 120 lb 8 oz (54.658 kg)  BMI 18.87 kg/m2  SpO2 100%  LMP 08/28/2015 (Approximate) Physical Exam  Constitutional: She is oriented to person, place, and time. She appears well-developed and well-nourished.  HENT:  Mouth/Throat: Uvula is midline, oropharynx is clear and moist and mucous membranes are normal.    2nd molar decayed and broken 3rd molar decayed Tenderness to gums around 2nd molar with swelling, erythema, and tenderness Uvula is midline with no edema or erythema Swelling at left jaw  Eyes:  Conjunctivae and EOM are normal.  Neck: Neck supple.  Anterior cervical node enlarged on left  Cardiovascular: Normal rate and regular rhythm.   Pulmonary/Chest: Effort normal and breath sounds normal.  Musculoskeletal: Normal range of motion.  Lymphadenopathy:    She has cervical adenopathy.  Neurological: She is alert and oriented to person, place, and time. No cranial nerve deficit.  Skin: Skin is warm and dry.  Psychiatric: She has a normal mood and affect. Her behavior is normal.  Nursing note and vitals reviewed.   ED Course  Procedures (including critical care time) DIAGNOSTIC STUDIES: Oxygen Saturation is 100% on RA,  normal by my interpretation.    COORDINATION OF  CARE: 9:39 PM Discussed treatment plan which includes discharge with abx with pt at bedside and pt agreed to plan.   MDM  25 y.o. female with dental pain and facial swelling stable for d/c without trismus, difficulty swallowing, fever and does not appear toxic. Will treat with antibiotics and NSAIDS. Patient will keep her f/u appointment next week. She will return here as needed.   Final diagnoses:  Dental abscess   I personally performed the services described in this documentation, which was scribed in my presence. The recorded information has been reviewed and is accurate.   [address] [place], [place] 09/11/15 2147  Nelva Nay, MD 09/13/15 [phone]

## 2015-09-11 NOTE — ED Notes (Signed)
Pt. reports worsening left lower dental pain with swelling onset this week , denies fever , respirations unlabored .

## 2015-09-11 NOTE — ED Notes (Signed)
Pt c/o pain and swelling to left lower jaw x's 3 days.  St's she does not know which tooth is hurting

## 2015-09-14 ENCOUNTER — Emergency Department (HOSPITAL_COMMUNITY)
Admission: EM | Admit: 2015-09-14 | Discharge: 2015-09-15 | Disposition: A | Discharge status: Home / Self Care | Payer: Medicaid Other | Attending: Emergency Medicine | Admitting: Emergency Medicine

## 2015-09-14 ENCOUNTER — Encounter (HOSPITAL_COMMUNITY): Payer: Self-pay | Admitting: Oncology

## 2015-09-14 DIAGNOSIS — Z791 Long term (current) use of non-steroidal anti-inflammatories (NSAID): Secondary | ICD-10-CM | POA: Insufficient documentation

## 2015-09-14 DIAGNOSIS — Z792 Long term (current) use of antibiotics: Secondary | ICD-10-CM | POA: Diagnosis not present

## 2015-09-14 DIAGNOSIS — J45909 Unspecified asthma, uncomplicated: Secondary | ICD-10-CM | POA: Diagnosis not present

## 2015-09-14 DIAGNOSIS — R110 Nausea: Secondary | ICD-10-CM | POA: Diagnosis not present

## 2015-09-14 DIAGNOSIS — Z8742 Personal history of other diseases of the female genital tract: Secondary | ICD-10-CM | POA: Insufficient documentation

## 2015-09-14 DIAGNOSIS — Z3202 Encounter for pregnancy test, result negative: Secondary | ICD-10-CM | POA: Diagnosis not present

## 2015-09-14 DIAGNOSIS — Z8619 Personal history of other infectious and parasitic diseases: Secondary | ICD-10-CM | POA: Insufficient documentation

## 2015-09-14 LAB — I-STAT BETA HCG BLOOD, ED (MC, WL, AP ONLY): I-stat hCG, quantitative: <5 m[IU]/mL (ref <5)

## 2015-09-14 MED ORDER — ONDANSETRON HCL 4 MG PO TABS: 4.0000 mg | ORAL_TABLET | Freq: Four times a day (QID) | ORAL | Status: DC

## 2015-09-14 MED ORDER — ONDANSETRON 8 MG PO TBDP
8.0000 mg | ORAL_TABLET | Freq: Once | ORAL | Status: AC
  Administered 2015-09-14: 8 mg via ORAL
  Filled 2015-09-14: qty 1

## 2015-09-14 NOTE — Discharge Instructions (Signed)
Please take your nausea medicine as prescribed. Continue taking your antibiotic. Your pregnancy test was negative. Follow up with your doctor as needed. Return to ED for new or worsening symptoms.  Nausea, Adult Nausea is the feeling that you have an upset stomach or have to vomit. Nausea by itself is not likely a serious concern, but it may be an early sign of more serious medical problems. As nausea gets worse, it can lead to vomiting. If vomiting develops, there is the risk of dehydration.  CAUSES   Viral infections.  Food poisoning.  Medicines.  Pregnancy.  Motion sickness.  Migraine headaches.  Emotional distress.  Severe pain from any source.  Alcohol intoxication. HOME CARE INSTRUCTIONS  Get plenty of rest.  Ask your caregiver about specific rehydration instructions.  Eat small amounts of food and sip liquids more often.  Take all medicines as told by your caregiver. SEEK MEDICAL CARE IF:  You have not improved after 2 days, or you get worse.  You have a headache. SEEK IMMEDIATE MEDICAL CARE IF:   You have a fever.  You faint.  You keep vomiting or have blood in your vomit.  You are extremely weak or dehydrated.  You have dark or bloody stools.  You have severe chest or abdominal pain. MAKE SURE YOU:  Understand these instructions.  Will watch your condition.  Will get help right away if you are not doing well or get worse.   This information is not intended to replace advice given to you by your health care provider. Make sure you discuss any questions you have with your health care provider.   Document Released: 06/17/2004 Document Revised: 05/31/2014 Document Reviewed: 01/20/2011 Elsevier Interactive Patient Education [employer] Inc[employer]

## 2015-09-14 NOTE — ED Notes (Signed)
Pt transported from home by EMS c/o nausea today, pt was just placed on Amoxicillin for dental issues.

## 2015-09-14 NOTE — ED Provider Notes (Signed)
CSN: [id number]     Arrival date & time 09/14/15  2103 History   First MD Initiated Contact with Patient 09/14/15 2204     Chief Complaint  Patient presents with  . Nausea     (Consider location/radiation/quality/duration/timing/severity/associated sxs/prior Treatment) HPI Felicia Castillo is a 25 y.o. female who comes in for evaluation of nausea. Patient reports she was seen in the emergency department 3 days ago for a dental infection and given antibiotics. She reports every time she takes antibiotics she feels queasy on her stomach. She reports she is not been able to eat very much secondary to the nausea. No other over abdominal pain. Last menstrual period 2 weeks ago. She is unsure if she could be pregnant. Denies any other fevers, emesis, urinary symptoms, diarrhea or constipation.  Past Medical History  Diagnosis Date  . Asthma     as a child  . Headache(784.0)   . Herpes genitalis   . Bacterial vaginosis    Past Surgical History  Procedure Laterality Date  . No past surgeries     Family History  Problem Relation Age of Onset  . Depression Mother   . Diabetes Father   . Hypertension Father   . Depression Brother   . Kidney disease Maternal Grandmother   . Kidney disease Paternal Grandmother    Social History  Substance Use Topics  . Smoking status: Never Smoker   . Smokeless tobacco: Never Used  . Alcohol Use: No   OB History    Gravida Para Term Preterm AB TAB SAB Ectopic Multiple Living   Review of Systems A 10 point review of systems was completed and was negative except for pertinent positives and negatives as mentioned in the history of present illness     Allergies  Review of patient's allergies indicates no known allergies.  Home Medications   Prior to Admission medications   Medication Sig Start Date End Date Taking? Authorizing Provider  amoxicillin (AMOXIL) 500 MG capsule Take 1 capsule (500 mg total) by mouth 3 (three)  times daily. 09/11/15  Yes Hope Orlene Och, NP  naproxen (NAPROSYN) 375 MG tablet Take 1 tablet (375 mg total) by mouth 2 (two) times daily. 09/11/15  Yes Hope Orlene Och, NP  metroNIDAZOLE (FLAGYL) 500 MG tablet Take 1 tablet (500 mg total) by mouth 2 (two) times daily. Patient not taking: Reported on 09/11/2015 07/28/15   Janne Napoleon, NP  ondansetron (ZOFRAN) 4 MG tablet Take 1 tablet (4 mg total) by mouth every 6 (six) hours. 09/14/15   Joycie Peek, PA-C   SpO2 98%  LMP 08/28/2015 (Approximate) Physical Exam  Constitutional: She is oriented to person, place, and time. She appears well-developed and well-nourished.  HENT:  Head: Normocephalic and atraumatic.  Mouth/Throat: Oropharynx is clear and moist.  Eyes: Conjunctivae are normal. Pupils are equal, round, and reactive to light. Right eye exhibits no discharge. Left eye exhibits no discharge. No scleral icterus.  Neck: Neck supple.  Cardiovascular: Normal rate, regular rhythm and normal heart sounds.   Pulmonary/Chest: Effort normal and breath sounds normal. No respiratory distress. She has no wheezes. She has no rales.  Abdominal: Soft. There is no tenderness.  Musculoskeletal: She exhibits no tenderness.  Neurological: She is alert and oriented to person, place, and time.  Cranial Nerves II-XII grossly intact  Skin: Skin is warm and dry. No rash noted.  Psychiatric: She has a  normal mood and affect.  Nursing note and vitals reviewed.   ED Course  Procedures (including critical care time) Labs Review Labs Reviewed  I-STAT BETA HCG BLOOD, ED ([facility], [facility], [facility] ONLY)    Imaging Review No results found. I have personally reviewed and evaluated these images and lab results as part of my medical decision-making.   EKG Interpretation None     Meds given in ED:  Medications  ondansetron (ZOFRAN-ODT) disintegrating tablet 8 mg (8 mg Oral Given 09/14/15 2332)    Discharge Medication List as of 09/14/2015 11:57 PM    START taking these  medications   Details  ondansetron (ZOFRAN) 4 MG tablet Take 1 tablet (4 mg total) by mouth every 6 (six) hours., Starting 09/14/2015, Until Discontinued, Print       Filed Vitals:   09/14/15 2109  SpO2: 98%    MDM  Felicia Castillo is a 25 y.o. female presents for evaluation of nausea after starting amoxicillin antibiotic. Nausea resolved with administration of Zofran ODT. She has a benign abdominal exam. Pregnancy is negative. She overall appears very well, nontoxic, hemodynamically stable and afebrile. Appropriate for outpatient follow-up. DC with prescription for Zofran and encouraged to continue taking antibiotics. Follow-up with PCP. Discussed strict return precautions. She verbalizes understanding and agrees with this plan. Final diagnoses:  Nausea      Joycie PeekBenjamin Jennylee Uehara, PA-C 09/15/15 1030  Loren Raceravid Yelverton, MD 09/22/15 2250

## 2015-09-14 NOTE — ED Notes (Signed)
Gold x2, light green, and purple top tubes sent down to lab

## 2015-09-15 NOTE — ED Notes (Signed)
Patient is alert and oriented x3.  She was given DC instructions and follow up visit instructions.  Patient gave verbal understanding. She was DC ambulatory under her own power to home.  V/S stable.  He was not showing any signs of distress on DC 

## 2015-10-02 ENCOUNTER — Ambulatory Visit: Payer: Medicaid Other

## 2015-10-02 VITALS — BP 116/70 | Wt 121.0 lb

## 2015-10-02 DIAGNOSIS — N926 Irregular menstruation, unspecified: Secondary | ICD-10-CM

## 2015-10-02 LAB — POCT URINE PREGNANCY: Preg Test, Ur: POSITIVE — AB

## 2015-10-08 ENCOUNTER — Emergency Department (HOSPITAL_COMMUNITY)
Admission: EM | Admit: 2015-10-08 | Discharge: 2015-10-08 | Disposition: A | Discharge status: Home / Self Care | Payer: Medicaid Other | Attending: Emergency Medicine | Admitting: Emergency Medicine

## 2015-10-08 ENCOUNTER — Telehealth: Payer: Self-pay | Admitting: *Deleted

## 2015-10-08 ENCOUNTER — Encounter (HOSPITAL_COMMUNITY): Payer: Self-pay | Admitting: Family Medicine

## 2015-10-08 DIAGNOSIS — Z8619 Personal history of other infectious and parasitic diseases: Secondary | ICD-10-CM | POA: Diagnosis not present

## 2015-10-08 DIAGNOSIS — J45909 Unspecified asthma, uncomplicated: Secondary | ICD-10-CM | POA: Insufficient documentation

## 2015-10-08 DIAGNOSIS — Z3202 Encounter for pregnancy test, result negative: Secondary | ICD-10-CM

## 2015-10-08 DIAGNOSIS — Z3200 Encounter for pregnancy test, result unknown: Secondary | ICD-10-CM | POA: Diagnosis present

## 2015-10-08 DIAGNOSIS — Z8742 Personal history of other diseases of the female genital tract: Secondary | ICD-10-CM | POA: Diagnosis not present

## 2015-10-08 LAB — POC URINE PREG, ED: Preg Test, Ur: NEGATIVE

## 2015-10-08 NOTE — ED Provider Notes (Signed)
CSN: [id number]     Arrival date & time 10/08/15  1143 History   First MD Initiated Contact with Patient 10/08/15 1154     Chief Complaint  Patient presents with  . Possible Pregnancy     (Consider location/radiation/quality/duration/timing/severity/associated sxs/prior Treatment) Patient is a 25 y.o. female presenting with pregnancy problem. The history is provided by the patient.  Possible Pregnancy This is a new problem. The current episode started more than 2 days ago. The problem occurs constantly. The problem has not changed since onset.Pertinent negatives include no chest pain, no headaches and no shortness of breath. Nothing aggravates the symptoms. Nothing relieves the symptoms. She has tried nothing for the symptoms. The treatment provided no relief.   25 yo F with a cc of concern she may be pregnant. [facility] for the same. Was told her pregnancy test was negative when she left on her computer record she saw that she was pregnant. Is here for confirmation. Denies any symptoms. Denies any cramping vaginal bleeding or vaginal discharge. Initially had some flulike symptoms have resolved.  Past Medical History  Diagnosis Date  . Asthma     as a child  . Headache(784.0)   . Herpes genitalis   . Bacterial vaginosis    Past Surgical History  Procedure Laterality Date  . No past surgeries     Family History  Problem Relation Age of Onset  . Depression Mother   . Diabetes Father   . Hypertension Father   . Depression Brother   . Kidney disease Maternal Grandmother   . Kidney disease Paternal Grandmother    Social History  Substance Use Topics  . Smoking status: Never Smoker   . Smokeless tobacco: Never Used  . Alcohol Use: No   OB History    Gravida Para Term Preterm AB TAB SAB Ectopic Multiple Living   1 1 1       1      Review of Systems  Constitutional: Negative for fever and chills.  HENT: Negative for congestion and  rhinorrhea.   Eyes: Negative for redness and visual disturbance.  Respiratory: Negative for shortness of breath and wheezing.   Cardiovascular: Negative for chest pain and palpitations.  Gastrointestinal: Negative for nausea and vomiting.  Genitourinary: Negative for dysuria and urgency.  Musculoskeletal: Negative for myalgias and arthralgias.  Skin: Negative for pallor and wound.  Neurological: Negative for dizziness and headaches.      Allergies  Review of patient's allergies indicates no known allergies.  Home Medications   Prior to Admission medications   Medication Sig Start Date End Date Taking? Authorizing Provider  ibuprofen (ADVIL,MOTRIN) 200 MG tablet Take 200 mg by mouth every 6 (six) hours as needed for moderate pain.   Yes Historical Provider, MD  amoxicillin (AMOXIL) 500 MG capsule Take 1 capsule (500 mg total) by mouth 3 (three) times daily. Patient not taking: Reported on 10/08/2015 09/11/15   Janne NapoleonHope M Neese, NP  metroNIDAZOLE (FLAGYL) 500 MG tablet Take 1 tablet (500 mg total) by mouth 2 (two) times daily. Patient not taking: Reported on 09/11/2015 07/28/15   Janne NapoleonHope M Neese, NP  naproxen (NAPROSYN) 375 MG tablet Take 1 tablet (375 mg total) by mouth 2 (two) times daily. Patient not taking: Reported on 10/08/2015 09/11/15   Janne NapoleonHope M Neese, NP  ondansetron (ZOFRAN) 4 MG tablet Take 1 tablet (4 mg total) by mouth every 6 (six) hours. Patient not taking: Reported on 10/08/2015 09/14/15  Benjamin Cartner, PA-C   BP 117/79 mmHg  Pulse 65  Temp(Src) 98 F (36.7 C) (Oral)  Resp 15  Ht  (1.702 m)  Wt 123 lb (55.792 kg)  BMI 19.26 kg/m2  SpO2 100%  LMP 08/28/2015 Physical Exam  Constitutional: She is oriented to person, place, and time. She appears well-developed and well-nourished. No distress.  HENT:  Head: Normocephalic and atraumatic.  Eyes: EOM are normal. Pupils are equal, round, and reactive to light.  Neck: Normal range of motion. Neck supple.  Cardiovascular:  Normal rate and regular rhythm.  Exam reveals no gallop and no friction rub.   No murmur heard. Pulmonary/Chest: Effort normal. She has no wheezes. She has no rales.  Abdominal: Soft. She exhibits no distension. There is no tenderness.  Musculoskeletal: She exhibits no edema or tenderness.  Neurological: She is alert and oriented to person, place, and time.  Skin: Skin is warm and dry. She is not diaphoretic.  Psychiatric: She has a normal mood and affect. Her behavior is normal.  Nursing note and vitals reviewed.   ED Course  Procedures (including critical care time) Labs Review Labs Reviewed  POC URINE PREG, ED    Imaging Review No results found. I have personally reviewed and evaluated these images and lab results as part of my medical decision-making.   EKG Interpretation None      MDM   Final diagnoses:  Pregnancy test negative    25 yo F with a negative pregnancy test.  D/c home.   3:11 PM:  I have discussed the diagnosis/risks/treatment options with the patient and family and believe the pt to be eligible for discharge home to follow-up with PCP. We also discussed returning to the ED immediately if new or worsening sx occur. We discussed the sx which are most concerning (e.g., sudden worsening pain, fever, inability to tolerate by mouth) that necessitate immediate return. Medications administered to the patient during their visit and any new prescriptions provided to the patient are listed below.  Medications given during this visit Medications - No data to display  Discharge Medication List as of 10/08/2015 12:48 PM      The patient appears reasonably screen and/or stabilized for discharge and I doubt any other medical condition or other [facility] requiring further screening, evaluation, or treatment in the ED at this time prior to discharge.      Melene Plan, DO 10/08/15 1511

## 2015-10-08 NOTE — ED Notes (Signed)
Pt ambulated to room from waiting room. Pt also ambulated to restroom.

## 2015-10-08 NOTE — Telephone Encounter (Signed)
Patient left message that she was in the office for UPT and was informed her test was negative- but when she checked her MyChart the result was marked positive.  2:30 Patient is at the hospital- before I could return her call.

## 2015-10-08 NOTE — Discharge Instructions (Signed)
Pregnancy Test Information °WHAT IS A PREGNANCY TEST? °A pregnancy test is used to detect the presence of human chorionic gonadotropin (hCG) in a sample of your urine or blood. hCG is a hormone produced by the cells of the placenta. The placenta is the organ that forms to nourish and support a developing baby. °This test requires a sample of either blood or urine. A pregnancy test determines whether you are pregnant or not. °HOW ARE PREGNANCY TESTS DONE? °Pregnancy tests are done using a home pregnancy test or having a blood or urine test done at your health care provider's office.  °Home pregnancy tests require a urine sample. °· Most kits use a plastic testing device with a strip of paper that indicates whether there is hCG in your urine. °· Follow the test instructions very carefully. °· After you urinate on the test stick, markings will appear to let you know whether you are pregnant. °· For best results, use your first urine of the morning. That is when the concentration of hCG is highest. °Having a blood test to check for pregnancy requires a sample of blood drawn from a vein in your hand or arm. Your health care provider will send your sample to a lab for testing. Results of a pregnancy test will be positive or negative. °IS ONE TYPE OF PREGNANCY TEST BETTER THAN ANOTHER? °In some cases, a blood test will return a positive result even if a urine test was negative because blood tests are more sensitive. This means blood tests can detect hCG earlier than home pregnancy tests.  °HOW ACCURATE ARE HOME PREGNANCY TESTS?  °Both types of pregnancy tests are very accurate. °· A blood test is about 98% accurate. °· When you are far enough along in your pregnancy and when used correctly, home pregnancy tests are equally accurate. °CAN ANYTHING INTERFERE WITH HOME PREGNANCY TEST RESULTS?  °It is possible for certain conditions to cause an inaccurate test result (false positive or false negative). °· A false positive is a  positive test result when you are not pregnant. This can happen if you: °¨ Are taking certain medicines, including anticonvulsants or tranquilizers. °¨ Have certain proteins in your blood. °· A false negative is a negative test result when you are pregnant. This can happen if you: °¨ Took the test before there was enough hCG to detect. A pregnancy test will not be positive in most women until 3-4 weeks after conception. °¨ Drank a lot of liquid before the test. Diluted urine samples can sometimes give an inaccurate result. °¨ Take certain medicines, such as water pills (diuretics) or some antihistamines. °WHAT SHOULD I DO IF I HAVE A POSITIVE PREGNANCY TEST? °If you have a positive pregnancy test, schedule an appointment with your health care provider. You might need additional testing to confirm the pregnancy. In the meantime, begin taking a prenatal vitamin, stop smoking, stop drinking alcohol, and do not use street drugs. °Talk to your health care provider about how to take care of yourself during your pregnancy. Ask about what to expect from the care you will need throughout pregnancy (prenatal care). °  °This information is not intended to replace advice given to you by your health care provider. Make sure you discuss any questions you have with your health care provider. °  °Document Released: 05/13/2003 Document Revised: 05/31/2014 Document Reviewed: 09/04/2013 °Elsevier Interactive Patient Education ©2016 [employer] Inc[employer] ° °

## 2015-10-08 NOTE — ED Notes (Signed)
Pt here for retest of pregnancy. [facility] and she checked my chart and was positive. sts nausea,.

## 2015-10-09 ENCOUNTER — Other Ambulatory Visit: Payer: Medicaid Other

## 2015-11-11 ENCOUNTER — Ambulatory Visit: Payer: Medicaid Other | Admitting: Obstetrics

## 2015-11-24 ENCOUNTER — Ambulatory Visit: Payer: Medicaid Other | Admitting: Obstetrics

## 2015-12-25 ENCOUNTER — Emergency Department (HOSPITAL_COMMUNITY)
Admission: EM | Admit: 2015-12-25 | Discharge: 2015-12-26 | Disposition: A | Discharge status: Home / Self Care | Payer: Medicaid Other | Attending: Emergency Medicine | Admitting: Emergency Medicine

## 2015-12-25 ENCOUNTER — Encounter (HOSPITAL_COMMUNITY): Payer: Self-pay | Admitting: *Deleted

## 2015-12-25 DIAGNOSIS — J45909 Unspecified asthma, uncomplicated: Secondary | ICD-10-CM | POA: Insufficient documentation

## 2015-12-25 DIAGNOSIS — N898 Other specified noninflammatory disorders of vagina: Secondary | ICD-10-CM | POA: Diagnosis not present

## 2015-12-25 DIAGNOSIS — Z79899 Other long term (current) drug therapy: Secondary | ICD-10-CM | POA: Insufficient documentation

## 2015-12-25 LAB — URINALYSIS, ROUTINE W REFLEX MICROSCOPIC
Bilirubin Urine: NEGATIVE
Glucose, UA: NEGATIVE mg/dL
Hgb urine dipstick: NEGATIVE
Ketones, ur: NEGATIVE mg/dL
Leukocytes, UA: NEGATIVE
Nitrite: NEGATIVE
Protein, ur: NEGATIVE mg/dL
Specific Gravity, Urine: 1.034 — ABNORMAL HIGH (ref 1.005–1.030)
pH: 5.5 (ref 5.0–8.0)

## 2015-12-25 LAB — WET PREP, GENITAL
Clue Cells Wet Prep HPF POC: NONE SEEN
Sperm: NONE SEEN
Trich, Wet Prep: NONE SEEN
WBC, Wet Prep HPF POC: NONE SEEN
Yeast Wet Prep HPF POC: NONE SEEN

## 2015-12-25 LAB — PREGNANCY, URINE: Preg Test, Ur: NEGATIVE

## 2015-12-25 NOTE — ED Triage Notes (Signed)
Pt states she has a new partner and came in for STD check. States she has some vaginal irritation, discharge.

## 2015-12-25 NOTE — ED Provider Notes (Signed)
[facility]-EMERGENCY DEPT Provider Note   CSN: [id number] Arrival date & time: 12/25/15  2043  First Provider Contact:  First MD Initiated Contact with Patient 12/25/15 2258   By signing my name below, I, [name], attest that this documentation has been prepared under the direction and in the presence of [employer], PA-C. Electronically Signed: [name], ED Scribe. 12/25/15. 11:09 PM.   History   Chief Complaint No chief complaint on file.  HPI Comments: Felicia Castillo is a 25 y.o. female who presents to the Emergency Department complaining of sudden onset, runny vaginal discharge onset 3 days ago. Pt also has vaginal irritation/itching. Pt took a Plan B almost two weeks ago. Pt's last sexual intercourse was two weeks ago. Pt reports she has h/o bacterial vaginosis and states her symptoms today are similar. Pt reports intermittent pain with intercourse that has been a reoccurring problem. She has seen her OB-GYN for this and had an ultrasound done which is unremarkable. Pt states she has a new partner and wants an STD check. Pt is scheduled for a pap smear next week. Denies abdominal pain, vaginal bleeding, dysuria, fever, chills, or any other associated symptoms.   The history is provided by the patient. No language interpreter was used.    Past Medical History:  Diagnosis Date  . Asthma    as a child  . Bacterial vaginosis   . Headache(784.0)   . Herpes genitalis     Patient Active Problem List   Diagnosis Date Noted  . 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 Candidiasis of vulva and vagina 10/30/2013  . BV (bacterial vaginosis) 10/10/2013  . Unspecified symptom associated with female genital organs 10/10/2013  . 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 Abnormal uterine bleeding 06/07/2013  . Unprotected sexual intercourse 06/07/2013  . Screening examination for venereal disease 06/07/2013  . Papanicolaou smear of cervix with low grade  squamous intraepithelial lesion (LGSIL) 04/04/2013  . LSIL (low grade squamous intraepithelial lesion) on Pap smear 03/01/2013  . Yeast infection 02/23/2013  . Well woman exam 02/23/2013  . Hemorrhoids 10/12/2012    Past Surgical History:  Procedure Laterality Date  . NO PAST SURGERIES      OB History    Gravida Para Term Preterm AB Living   SAB TAB Ectopic Multiple Live Births           1       Home Medications    Prior to Admission medications   Medication Sig Start Date End Date Taking? Authorizing Provider  amoxicillin (AMOXIL) 500 MG capsule Take 1 capsule (500 mg total) by mouth 3 (three) times daily. Patient not taking: Reported on 10/08/2015 09/11/15   Janne Napoleon, NP  ibuprofen (ADVIL,MOTRIN) 200 MG tablet Take 200 mg by mouth every 6 (six) hours as needed for moderate pain.    Historical Provider, MD  metroNIDAZOLE (FLAGYL) 500 MG tablet Take 1 tablet (500 mg total) by mouth 2 (two) times daily. Patient not taking: Reported on 09/11/2015 07/28/15   Janne Napoleon, NP  naproxen (NAPROSYN) 375 MG tablet Take 1 tablet (375 mg total) by mouth 2 (two) times daily. Patient not taking: Reported on 10/08/2015 09/11/15   Janne Napoleon, NP  ondansetron (ZOFRAN) 4 MG tablet Take 1 tablet (4 mg total) by mouth every 6 (six) hours. Patient not taking: Reported on 10/08/2015 09/14/15   Joycie Peek, PA-C  Family History Family History  Problem Relation Age of Onset  . Depression Mother   . Diabetes Father   . Hypertension Father   . Depression Brother   . Kidney disease Maternal Grandmother   . Kidney disease Paternal Grandmother     Social History Social History  Substance Use Topics  . Smoking status: Never Smoker  . Smokeless tobacco: Never Used  . Alcohol use No     Allergies   Review of patient's allergies indicates no known allergies.   Review of Systems Review of Systems  Constitutional: Negative for chills and fever.  Gastrointestinal:  Negative for abdominal pain.  Genitourinary: Positive for vaginal discharge. Negative for dysuria.     Physical Exam Updated Vital Signs BP 124/80 (BP Location: Right Arm)   Pulse 63   Temp 98 F (36.7 C) (Oral)   Resp 18   SpO2 100%   Physical Exam  Constitutional: She is oriented to person, place, and time. She appears well-developed and well-nourished. No distress.  HENT:  Head: Normocephalic and atraumatic.  Eyes: Conjunctivae are normal. Right eye exhibits no discharge. Left eye exhibits no discharge. No scleral icterus.  Cardiovascular: Normal rate.   Pulmonary/Chest: Effort normal.  Genitourinary:  Genitourinary Comments: Chaperone present throughout entire exam. No external genital lesions, no CMT or friability. Minimal white discharge in vaginal vault. Cervical os closed. No vaginal bleeding. No adnaxal tenderness.  Neurological: She is alert and oriented to person, place, and time. Coordination normal.  Skin: Skin is warm and dry. No rash noted. She is not diaphoretic. No erythema. No pallor.  Psychiatric: She has a normal mood and affect. Her behavior is normal.  Nursing note and vitals reviewed.    ED Treatments / Results  DIAGNOSTIC STUDIES: Oxygen Saturation is 100% on RA, normal by my interpretation.    COORDINATION OF CARE: 11:08 PM Discussed treatment plan with pt at bedside and pt agreed to plan.  Labs (all labs ordered are listed, but only abnormal results are displayed) Labs Reviewed - No data to display  EKG  EKG Interpretation None       Radiology No results found.  Procedures Procedures (including critical care time)  Medications Ordered in ED Medications - No data to display   Initial Impression / Assessment and Plan / ED Course  I have reviewed the triage vital signs and the nursing notes.  Pertinent labs & imaging results that were available during my care of the patient were reviewed by me and considered in my medical decision  making (see chart for details).  Clinical Course   25 y.o F presents to the ED today c/o vaginal discharge after sexual intercourse with a new partner. On exam, no CMT or signs of PID. Minimal vaginal discharge in vaginal vault. Wet prep does not show any clue cells, trich or WBC. Pt states that she does not want prophylactic treatment for GC or chlamydia at this time. Cultures were sent. UA negative for infection. Pregnacy negative. Pt has scheduled follow up with OBGYN next week. Return precautions outlined in patient discharge instructions.   Final Clinical Impressions(s) / ED Diagnoses   Final diagnoses:  Vaginal discharge    New Prescriptions New Prescriptions   No medications on file  I personally performed the services described in this documentation, which was scribed in my presence. The recorded information has been reviewed and is accurate.      Lester Kinsman [place], PA-C 12/27/15 1354    Arby Barrette, MD 12/28/15  0720  

## 2015-12-25 NOTE — Discharge Instructions (Signed)
Your vaginal swabs did not show any evidence for yeast or BV. You still have gonorrhea and chlamydia cultures pending. These will result in the next 2-3 days. Follow up with your OBGYN for re-evaluation. Recommend OTC vagisil for symptomatic relief. Return to the ED if you experience severe worsening of your symptoms, vaginal bleeding. Abdominal pain or fevers.

## 2015-12-26 LAB — GC/CHLAMYDIA PROBE AMP (~~LOC~~) NOT AT ARMC
Chlamydia: NEGATIVE
Neisseria Gonorrhea: NEGATIVE

## 2015-12-26 NOTE — ED Notes (Signed)
Patient able to ambulate independently  

## 2015-12-30 ENCOUNTER — Ambulatory Visit: Payer: Medicaid Other | Admitting: Obstetrics

## 2016-01-05 ENCOUNTER — Telehealth: Payer: Self-pay | Admitting: *Deleted

## 2016-01-05 NOTE — Telephone Encounter (Signed)
[name] is requesting birth control to be called to her pharmacy. We can not do that- she needs an annual exam.

## 2016-01-07 ENCOUNTER — Encounter: Payer: Self-pay | Admitting: Obstetrics

## 2016-01-07 ENCOUNTER — Ambulatory Visit (INDEPENDENT_AMBULATORY_CARE_PROVIDER_SITE_OTHER): Payer: Medicaid Other | Admitting: Obstetrics

## 2016-01-07 VITALS — BP 129/81 | HR 54 | Ht 67.0 in | Wt 125.0 lb

## 2016-01-07 DIAGNOSIS — Z0000 Encounter for general adult medical examination without abnormal findings: Secondary | ICD-10-CM | POA: Diagnosis not present

## 2016-01-07 DIAGNOSIS — Z01419 Encounter for gynecological examination (general) (routine) without abnormal findings: Secondary | ICD-10-CM

## 2016-01-07 DIAGNOSIS — Z30011 Encounter for initial prescription of contraceptive pills: Secondary | ICD-10-CM

## 2016-01-07 DIAGNOSIS — Z3009 Encounter for other general counseling and advice on contraception: Secondary | ICD-10-CM

## 2016-01-07 DIAGNOSIS — Z113 Encounter for screening for infections with a predominantly sexual mode of transmission: Secondary | ICD-10-CM

## 2016-01-07 MED ORDER — NORETHIN ACE-ETH ESTRAD-FE 1-20 MG-MCG(24) PO TABS
1.0000 | ORAL_TABLET | Freq: Every day | ORAL | 11 refills | Status: DC
Start: 2016-01-07 — End: 2017-01-11

## 2016-01-07 NOTE — Progress Notes (Signed)
Patient ID: Felicia Castillo, female   DOB: 03-Oct-1990, 25 y.o.   MRN: [id number]   Subjective:        Felicia Castillo is a 25 y.o. female here for a routine exam.  Current complaints: None.    Personal health questionnaire:  Is patient Ashkenazi Jewish, have a family history of breast and/or ovarian cancer: no Is there a family history of uterine cancer diagnosed at age < 550, gastrointestinal cancer, urinary tract cancer, family member who is a Personnel officerLynch syndrome-associated carrier: no Is the patient overweight and hypertensive, family history of diabetes, personal history of gestational diabetes, preeclampsia or PCOS: no Is patient over 7755, have PCOS,  family history of premature CHD under age 26, diabetes, smoke, have hypertension or peripheral artery disease:  no At any time, has a partner hit, kicked or otherwise hurt or frightened you?: no Over the past 2 weeks, have you felt down, depressed or hopeless?: no Over the past 2 weeks, have you felt little interest or pleasure in doing things?:no   Gynecologic History Patient's last menstrual period was 12/30/2015. Contraception: condoms Last Pap: 2014. Results were: abnormal ( LGSIL ).  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 Last mammogram: n/a. Results were: n/a  Obstetric History OB History  Gravida Para Term Preterm AB Living  1 1 1     1   SAB TAB Ectopic Multiple Live Births          1    # Outcome Date GA Lbr Len/2nd Weight Sex Delivery Anes PTL Lv  1 Term 03/03/08 230w0d  8 lb 1 oz (3.657 kg) F Vag-Spont None  LIV      Past Medical History:  Diagnosis Date  . Asthma    as a child  . Bacterial vaginosis   . Headache(784.0)   . Herpes genitalis     Past Surgical History:  Procedure Laterality Date  . NO PAST SURGERIES       Current Outpatient Prescriptions:  .  naproxen (NAPROSYN) 375 MG tablet, Take 1 tablet (375 mg total) by mouth 2 (two) times daily., Disp: 20 tablet, Rfl: 0 .  ondansetron (ZOFRAN) 4 MG  tablet, Take 1 tablet (4 mg total) by mouth every 6 (six) hours. (Patient not taking: Reported on 10/08/2015), Disp: 12 tablet, Rfl: 0 No Known Allergies  Social History  Substance Use Topics  . Smoking status: Never Smoker  . Smokeless tobacco: Never Used  . Alcohol use No    Family History  Problem Relation Age of Onset  . Depression Mother   . Diabetes Father   . Hypertension Father   . Depression Brother   . Kidney disease Maternal Grandmother   . Kidney disease Paternal Grandmother       Review of Systems  Constitutional: negative for fatigue and weight loss Respiratory: negative for cough and wheezing Cardiovascular: negative for chest pain, fatigue and palpitations Gastrointestinal: negative for abdominal pain and change in bowel habits Musculoskeletal:negative for myalgias Neurological: negative for gait problems and tremors Behavioral/Psych: negative for abusive relationship, depression Endocrine: negative for temperature intolerance   Genitourinary:negative for abnormal menstrual periods, genital lesions, hot flashes, sexual problems and vaginal discharge Integument/breast: negative for breast lump, breast tenderness, nipple discharge and skin lesion(s)    Objective:       BP 129/81   Pulse (!) 54   Ht 5\' 7"  (1.702 m)   Wt 125 lb (56.7 kg)   LMP 12/30/2015   BMI 19.58 kg/m  General:  alert  Skin:   no rash or abnormalities  Lungs:   clear to auscultation bilaterally  Heart:   regular rate and rhythm, S1, S2 normal, no murmur, click, rub or gallop  Breasts:   normal without suspicious masses, skin or nipple changes or axillary nodes  Abdomen:  normal findings: no organomegaly, soft, non-tender and no hernia  Pelvis:  External genitalia: normal general appearance Urinary system: urethral meatus normal and bladder without fullness, nontender Vaginal: normal without tenderness, induration or masses Cervix: normal appearance Adnexa: normal bimanual  exam Uterus: anteverted and non-tender, normal size   Lab Review Urine pregnancy test Labs reviewed yes Radiologic studies reviewed no    Assessment:    Healthy female exam.    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  Wants OCP's   Plan:    Needs cryocautery of cervix if this pap is abnormal  Lo Loestrin 24 Rx    Education reviewed: low fat, low cholesterol diet, safe sex/STD prevention, self breast exams and weight bearing exercise. Contraception: OCP (estrogen/progesterone). Follow up in: 1 year.   No orders of the defined types were placed in this encounter.  No orders of the defined types were placed in this encounter.

## 2016-01-08 LAB — HEPATITIS B SURFACE ANTIGEN: Hepatitis B Surface Ag: NEGATIVE

## 2016-01-08 LAB — HIV ANTIBODY (ROUTINE TESTING W REFLEX): HIV Screen 4th Generation wRfx: NONREACTIVE

## 2016-01-08 LAB — HEPATITIS C ANTIBODY

## 2016-01-08 LAB — RPR: RPR Ser Ql: NONREACTIVE

## 2016-01-10 LAB — NUSWAB VG+, CANDIDA 6SP
ATOPOBIUM VAGINAE: HIGH {score} — AB
BVAB 2: HIGH Score — AB
CANDIDA GLABRATA, NAA: NEGATIVE
CANDIDA KRUSEI, NAA: NEGATIVE
CANDIDA LUSITANIAE, NAA: NEGATIVE
Candida albicans, NAA: NEGATIVE
Candida parapsilosis, NAA: NEGATIVE
Candida tropicalis, NAA: NEGATIVE
Chlamydia trachomatis, NAA: NEGATIVE
Megasphaera 1: HIGH Score — AB
NEISSERIA GONORRHOEAE, NAA: NEGATIVE
TRICH VAG BY NAA: NEGATIVE

## 2016-01-11 ENCOUNTER — Other Ambulatory Visit: Payer: Self-pay | Admitting: Obstetrics

## 2016-01-11 DIAGNOSIS — B9689 Other specified bacterial agents as the cause of diseases classified elsewhere: Secondary | ICD-10-CM

## 2016-01-11 DIAGNOSIS — N760 Acute vaginitis: Principal | ICD-10-CM

## 2016-01-11 MED ORDER — METRONIDAZOLE 500 MG PO TABS: 500.0000 mg | ORAL_TABLET | Freq: Two times a day (BID) | ORAL | 2 refills | Status: DC

## 2016-01-15 ENCOUNTER — Telehealth: Payer: Self-pay | Admitting: *Deleted

## 2016-01-15 NOTE — Telephone Encounter (Signed)
Patient made aware of lab result and medication sent to pharmacy.Cautioned not to drink alcohol with this medication.

## 2016-01-16 LAB — PAP IG W/ RFLX HPV ASCU: PAP Smear Comment: 0

## 2016-01-16 LAB — HPV DNA PROBE HIGH RISK, AMPLIFIED: HPV, high-risk: POSITIVE — AB

## 2016-02-10 ENCOUNTER — Ambulatory Visit: Payer: Medicaid Other | Admitting: Obstetrics

## 2016-03-17 ENCOUNTER — Ambulatory Visit: Payer: Medicaid Other | Admitting: Obstetrics

## 2016-03-24 ENCOUNTER — Telehealth: Payer: Self-pay | Admitting: *Deleted

## 2016-03-24 DIAGNOSIS — Z7251 High risk heterosexual behavior: Secondary | ICD-10-CM

## 2016-03-24 DIAGNOSIS — Z30011 Encounter for initial prescription of contraceptive pills: Secondary | ICD-10-CM

## 2016-03-24 MED ORDER — NORETHIN-ETH ESTRAD-FE BIPHAS 1 MG-10 MCG / 10 MCG PO TABS: 1.0000 | ORAL_TABLET | Freq: Every day | ORAL | 11 refills | Status: DC

## 2016-03-24 MED ORDER — LEVONORGESTREL 0.75 MG PO TABS: 0.7500 mg | ORAL_TABLET | Freq: Two times a day (BID) | ORAL | 0 refills | Status: DC

## 2016-03-24 NOTE — Telephone Encounter (Signed)
Patient is requesting Plan B be called to her pharmacy. 3:30 Call to patient- her IC was yesterday. She is not using her OCP- she wants to use Lo Loestrin in stead of the generic called in. Told patient we could send it and see if her insurance will cover. Plan B sent per protocol.

## 2016-04-05 ENCOUNTER — Ambulatory Visit: Payer: Medicaid Other | Admitting: Obstetrics

## 2016-04-12 ENCOUNTER — Encounter (HOSPITAL_COMMUNITY): Payer: Self-pay | Admitting: Emergency Medicine

## 2016-04-12 ENCOUNTER — Emergency Department (HOSPITAL_COMMUNITY)
Admission: EM | Admit: 2016-04-12 | Discharge: 2016-04-12 | Disposition: A | Discharge status: Home / Self Care | Payer: Medicaid Other | Attending: Emergency Medicine | Admitting: Emergency Medicine

## 2016-04-12 DIAGNOSIS — K0510 Chronic gingivitis, plaque induced: Secondary | ICD-10-CM

## 2016-04-12 DIAGNOSIS — J45909 Unspecified asthma, uncomplicated: Secondary | ICD-10-CM | POA: Insufficient documentation

## 2016-04-12 DIAGNOSIS — K0889 Other specified disorders of teeth and supporting structures: Secondary | ICD-10-CM

## 2016-04-12 DIAGNOSIS — Z79899 Other long term (current) drug therapy: Secondary | ICD-10-CM | POA: Diagnosis not present

## 2016-04-12 DIAGNOSIS — K0501 Acute gingivitis, non-plaque induced: Secondary | ICD-10-CM | POA: Diagnosis not present

## 2016-04-12 MED ORDER — PENICILLIN V POTASSIUM 500 MG PO TABS: 500.0000 mg | ORAL_TABLET | Freq: Four times a day (QID) | ORAL | 0 refills | Status: DC

## 2016-04-12 MED ORDER — CHLORHEXIDINE GLUCONATE 0.12 % MT SOLN: 15.0000 mL | Freq: Two times a day (BID) | OROMUCOSAL | 0 refills | Status: DC

## 2016-04-12 MED ORDER — NAPROXEN 375 MG PO TABS: 375.0000 mg | ORAL_TABLET | Freq: Two times a day (BID) | ORAL | 0 refills | Status: DC

## 2016-04-12 NOTE — ED Triage Notes (Signed)
Pt reports right sided tooth pain history of abscess. Pt also reports starting a new birth control on nov 11Th and has been having spotting since. NO large amount of vaginal bleeding or any c/o of abd pain just concerned if she "should continue taking med".

## 2016-04-12 NOTE — ED Notes (Signed)
D/C papers reviewed with patient and she verbalizes understanding and intent to follow up

## 2016-04-12 NOTE — ED Provider Notes (Signed)
[facility]-EMERGENCY DEPT Provider Note   CSN: [id number] Arrival date & time: 04/12/16  1647  By signing my name below, I, [name], attest that this documentation has been prepared under the direction and in the presence of Roxy Horsemanobert Kendre Sires, [place]. Electronically Signed: [name], Scribe. 04/12/16. 5:20 PM.   History   Chief Complaint Chief Complaint  Patient presents with  . Dental Pain    The history is provided by the patient. No language interpreter was used.    HPI Comments: Felicia Castillo is a 25 y.o. female who presents to the Emergency Department complaining of dental pain that began yesterday.  She states the pain worsened when she ate this morning. She reports a history of dental abscess in her left cheek this summer and gingivitis. She was prescribed Naproxen for pain.  She also complains of some spotting since changing her birth control pills.  Denies and abdominal pain or lightheadedness.  Past Medical History:  Diagnosis Date  . Asthma    as a child  . Bacterial vaginosis   . Headache(784.0)   . Herpes genitalis     Patient Active Problem List   Diagnosis Date Noted  . 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 Candidiasis of vulva and vagina 10/30/2013  . BV (bacterial vaginosis) 10/10/2013  . Unspecified symptom associated with female genital organs 10/10/2013  . 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 Abnormal uterine bleeding 06/07/2013  . Unprotected sexual intercourse 06/07/2013  . Screening examination for venereal disease 06/07/2013  . Papanicolaou smear of cervix with low grade squamous intraepithelial lesion (LGSIL) 04/04/2013  . LSIL (low grade squamous intraepithelial lesion) on Pap smear 03/01/2013  . Yeast infection 02/23/2013  . Well woman exam 02/23/2013  . Hemorrhoids 10/12/2012    Past Surgical History:  Procedure Laterality Date  . NO PAST SURGERIES      OB History    Gravida Para Term Preterm AB Living   1 1 1     1    SAB TAB Ectopic Multiple Live Births           1       Home Medications    Prior to Admission medications   Medication Sig Start Date End Date Taking? Authorizing Provider  levonorgestrel (PLAN B) 0.75 MG tablet Take 1 tablet (0.75 mg total) by mouth every 12 (twelve) hours. 03/24/16   Brock Badharles A Harper, MD  metroNIDAZOLE (FLAGYL) 500 MG tablet Take 1 tablet (500 mg total) by mouth 2 (two) times daily. 01/11/16   Brock Badharles A Harper, MD  naproxen (NAPROSYN) 375 MG tablet Take 1 tablet (375 mg total) by mouth 2 (two) times daily. 09/11/15   Hope Orlene OchM Neese, NP  Norethindrone Acetate-Ethinyl Estrad-FE (LOESTRIN 24 FE) 1-20 MG-MCG(24) tablet Take 1 tablet by mouth daily. 01/07/16   Brock Badharles A Harper, MD  Norethindrone-Ethinyl Estradiol-Fe Biphas (LO LOESTRIN FE) 1 MG-10 MCG / 10 MCG tablet Take 1 tablet by mouth daily. 03/24/16   Brock Badharles A Harper, MD  ondansetron (ZOFRAN) 4 MG tablet Take 1 tablet (4 mg total) by mouth every 6 (six) hours. Patient not taking: Reported on 10/08/2015 09/14/15   Joycie PeekBenjamin Cartner, PA-C    Family History Family History  Problem Relation Age of Onset  . Depression Mother   . Diabetes Father   . Hypertension Father   . Depression Brother   . Kidney disease Maternal Grandmother   . Kidney disease Paternal Grandmother     Social History Social History  Substance Use Topics  . Smoking status: Never Smoker  . Smokeless tobacco: Never Used  . Alcohol use No     Allergies   Patient has no known allergies.   Review of Systems Review of Systems  Constitutional: Negative for chills and fever.  HENT: Positive for dental problem (right upper molars). Negative for drooling.   Neurological: Positive for headaches. Negative for speech difficulty.  Psychiatric/Behavioral: Positive for sleep disturbance.     Physical Exam Updated Vital Signs BP 143/88 (BP Location: Right Arm)   Pulse 63   Temp 98.3 F (36.8 C) (Oral)   Resp 17   SpO2 100%    Physical Exam Physical Exam  Constitutional: Pt appears well-developed and well-nourished.  HENT:  Head: Normocephalic.  Right Ear: Tympanic membrane, external ear and ear canal normal.  Left Ear: Tympanic membrane, external ear and ear canal normal.  Nose: Nose normal. Right sinus exhibits no maxillary sinus tenderness and no frontal sinus tenderness. Left sinus exhibits no maxillary sinus tenderness and no frontal sinus tenderness.  Mouth/Throat: Uvula is midline, oropharynx is clear and moist and mucous membranes are normal. No oral lesions. No uvula swelling or lacerations. No oropharyngeal exudate, posterior oropharyngeal edema, posterior oropharyngeal erythema or tonsillar abscesses.  Poor dentition No gingival swelling, fluctuance or induration No gross abscess  No sublingual edema, tenderness to palpation, or sign of Ludwig's angina, or deep space infection Pain at right upper rear molars Eyes: Conjunctivae are normal. Pupils are equal, round, and reactive to light. Right eye exhibits no discharge. Left eye exhibits no discharge.  Neck: Normal range of motion. Neck supple.  No stridor Handling secretions without difficulty No nuchal rigidity No cervical lymphadenopathy Cardiovascular: Normal rate, regular rhythm and normal heart sounds.   Pulmonary/Chest: Effort normal. No respiratory distress.  Equal chest rise  Abdominal: Soft. Bowel sounds are normal. Pt exhibits no distension. There is no tenderness.  Lymphadenopathy: Pt has no cervical adenopathy.  Neurological: Pt is alert and oriented x 4  Skin: Skin is warm and dry.  Psychiatric: Pt has a normal mood and affect.  Nursing note and vitals reviewed.   ED Treatments / Results  DIAGNOSTIC STUDIES: Oxygen Saturation is 100% on RA, normal by my interpretation.    COORDINATION OF CARE: 5:16 PM Discussed treatment plan with pt at bedside and pt agreed to plan.   Labs (all labs ordered are listed, but only abnormal  results are displayed) Labs Reviewed - No data to display  EKG  EKG Interpretation None       Radiology No results found.  Procedures Procedures (including critical care time)  Medications Ordered in ED Medications - No data to display   Initial Impression / Assessment and Plan / ED Course  I have reviewed the triage vital signs and the nursing notes.  Pertinent labs & imaging results that were available during my care of the patient were reviewed by me and considered in my medical decision making (see chart for details).  Clinical Course     Patient with dentalgia.  No abscess requiring immediate incision and drainage.  Exam not concerning for Ludwig's angina or pharyngeal abscess.  Will treat with penicillin and peridex. Pt instructed to follow-up with dentist.  Discussed return precautions. Pt safe for discharge.   Final Clinical Impressions(s) / ED Diagnoses   Final diagnoses:  Pain, dental  Gingivitis    New Prescriptions Discharge Medication List as of 04/12/2016  5:21 PM    START taking these medications  Details  chlorhexidine (PERIDEX) 0.12 % solution Use as directed 15 mLs in the mouth or throat 2 (two) times daily. Swish and spit, Starting Mon 04/12/2016, Print    penicillin v potassium (VEETID) 500 MG tablet Take 1 tablet (500 mg total) by mouth 4 (four) times daily., Starting Mon 04/12/2016, Print       I personally performed the services described in this documentation, which was scribed in my presence. The recorded information has been reviewed and is accurate.       Roxy Horseman, PA-C 04/12/16 1741    Eber Hong, MD 04/13/16 2107

## 2016-04-23 ENCOUNTER — Ambulatory Visit (INDEPENDENT_AMBULATORY_CARE_PROVIDER_SITE_OTHER): Payer: Medicaid Other | Admitting: Obstetrics

## 2016-04-23 ENCOUNTER — Encounter: Payer: Self-pay | Admitting: Obstetrics

## 2016-04-23 ENCOUNTER — Other Ambulatory Visit (HOSPITAL_COMMUNITY)
Admission: RE | Admit: 2016-04-23 | Discharge: 2016-04-23 | Disposition: A | Discharge status: Home / Self Care | Payer: Medicaid Other | Source: Ambulatory Visit | Attending: Obstetrics | Admitting: Obstetrics

## 2016-04-23 VITALS — BP 105/68 | HR 65 | Temp 98.7°F | Wt 127.1 lb

## 2016-04-23 DIAGNOSIS — B373 Candidiasis of vulva and vagina: Secondary | ICD-10-CM

## 2016-04-23 DIAGNOSIS — Z23 Encounter for immunization: Secondary | ICD-10-CM

## 2016-04-23 DIAGNOSIS — Z01419 Encounter for gynecological examination (general) (routine) without abnormal findings: Secondary | ICD-10-CM

## 2016-04-23 DIAGNOSIS — B3731 Acute candidiasis of vulva and vagina: Secondary | ICD-10-CM

## 2016-04-23 DIAGNOSIS — B9689 Other specified bacterial agents as the cause of diseases classified elsewhere: Secondary | ICD-10-CM

## 2016-04-23 DIAGNOSIS — N760 Acute vaginitis: Secondary | ICD-10-CM

## 2016-04-23 MED ORDER — FLUCONAZOLE 150 MG PO TABS: 150.0000 mg | ORAL_TABLET | Freq: Once | ORAL | 2 refills | Status: AC

## 2016-04-23 MED ORDER — CLINDAMYCIN HCL 300 MG PO CAPS: 300.0000 mg | ORAL_CAPSULE | Freq: Three times a day (TID) | ORAL | 1 refills | Status: DC

## 2016-04-23 NOTE — Patient Instructions (Addendum)
Bacterial Vaginosis Bacterial vaginosis is a vaginal infection that occurs when the normal balance of bacteria in the vagina is disrupted. It results from an overgrowth of certain bacteria. This is the most common vaginal infection among women ages 15-44. Because bacterial vaginosis increases your risk for STIs (sexually transmitted infections), getting treated can help reduce your risk for chlamydia, gonorrhea, herpes, and HIV (human immunodeficiency virus). Treatment is also important for preventing complications in pregnant women, because this condition can cause an early (premature) delivery. What are the causes? This condition is caused by an increase in harmful bacteria that are normally present in small amounts in the vagina. However, the reason that the condition develops is not fully understood. What increases the risk? The following factors may make you more likely to develop this condition:  Having a new sexual partner or multiple sexual partners.  Having unprotected sex.  Douching.  Having an intrauterine device (IUD).  Smoking.  Drug and alcohol abuse.  Taking certain antibiotic medicines.  Being pregnant.  You cannot get bacterial vaginosis from toilet seats, bedding, swimming pools, or contact with objects around you. What are the signs or symptoms? Symptoms of this condition include:  Grey or white vaginal discharge. The discharge can also be watery or foamy.  A fish-like odor with discharge, especially after sexual intercourse or during menstruation.  Itching in and around the vagina.  Burning or pain with urination.  Some women with bacterial vaginosis have no signs or symptoms. How is this diagnosed? This condition is diagnosed based on:  Your medical history.  A physical exam of the vagina.  Testing a sample of vaginal fluid under a microscope to look for a large amount of bad bacteria or abnormal cells. Your health care provider may use a cotton swab  or a small wooden spatula to collect the sample.  How is this treated? This condition is treated with antibiotics. These may be given as a pill, a vaginal cream, or a medicine that is put into the vagina (suppository). If the condition comes back after treatment, a second round of antibiotics may be needed. Follow these instructions at home: Medicines  Take over-the-counter and prescription medicines only as told by your health care provider.  Take or use your antibiotic as told by your health care provider. Do not stop taking or using the antibiotic even if you start to feel better. General instructions  If you have a female sexual partner, tell her that you have a vaginal infection. She should see her health care provider and be treated if she has symptoms. If you have a female sexual partner, he does not need treatment.  During treatment: ? Avoid sexual activity until you finish treatment. ? Do not douche. ? Avoid alcohol as directed by your health care provider. ? Avoid breastfeeding as directed by your health care provider.  Drink enough water and fluids to keep your urine clear or pale yellow.  Keep the area around your vagina and rectum clean. ? Wash the area daily with warm water. ? Wipe yourself from front to back after using the toilet.  Keep all follow-up visits as told by your health care provider. This is important. How is this prevented?  Do not douche.  Wash the outside of your vagina with warm water only.  Use protection when having sex. This includes latex condoms and dental dams.  Limit how many sexual partners you have. To help prevent bacterial vaginosis, it is best to have sex with just   one partner (monogamous).  Make sure you and your sexual partner are tested for STIs.  Wear cotton or cotton-lined underwear.  Avoid wearing tight pants and pantyhose, especially during summer.  Limit the amount of alcohol that you drink.  Do not use any products that  contain nicotine or tobacco, such as cigarettes and e-cigarettes. If you need help quitting, ask your health care provider.  Do not use illegal drugs. Where to find more information:  Centers for Disease Control and Prevention: www.cdc.gov/std  American Sexual Health Association (ASHA): www.ashastd.org  U.S. Department of Health and Human Services, Office on Women's Health: www.womenshealth.gov/ or https://www.womenshealth.gov/a-z-topics/bacterial-vaginosis Contact a health care provider if:  Your symptoms do not improve, even after treatment.  You have more discharge or pain when urinating.  You have a fever.  You have pain in your abdomen.  You have pain during sex.  You have vaginal bleeding between periods. Summary  Bacterial vaginosis is a vaginal infection that occurs when the normal balance of bacteria in the vagina is disrupted.  Because bacterial vaginosis increases your risk for STIs (sexually transmitted infections), getting treated can help reduce your risk for chlamydia, gonorrhea, herpes, and HIV (human immunodeficiency virus). Treatment is also important for preventing complications in pregnant women, because the condition can cause an early (premature) delivery.  This condition is treated with antibiotic medicines. These may be given as a pill, a vaginal cream, or a medicine that is put into the vagina (suppository). This information is not intended to replace advice given to you by your health care provider. Make sure you discuss any questions you have with your health care provider. Document Released: 05/10/2005 Document Revised: 01/24/2016 Document Reviewed: 01/24/2016 Elsevier Interactive Patient Education  2017 Elsevier Inc.  

## 2016-04-23 NOTE — Progress Notes (Signed)
Subjective:        Felicia Castillo is a 25 y.o. female here for a routine exam.  Current complaints: Malodorous vaginal discharge.    Personal health questionnaire:  Is patient Ashkenazi Jewish, have a family history of breast and/or ovarian cancer: no Is there a family history of uterine cancer diagnosed at age < 2750, gastrointestinal cancer, urinary tract cancer, family member who is a Personnel officerLynch syndrome-associated carrier: no Is the patient overweight and hypertensive, family history of diabetes, personal history of gestational diabetes, preeclampsia or PCOS: no Is patient over 6655, have PCOS,  family history of premature CHD under age 565, diabetes, smoke, have hypertension or peripheral artery disease:  no At any time, has a partner hit, kicked or otherwise hurt or frightened you?: no Over the past 2 weeks, have you felt down, depressed or hopeless?: no Over the past 2 weeks, have you felt little interest or pleasure in doing things?:no   Gynecologic History Patient's last menstrual period was 04/01/2016 (exact date). Contraception: OCP (estrogen/progesterone) Last Pap: unknown. Results were: normal Last mammogram: n/a. Results were: n/a  Obstetric History OB History  Gravida Para Term Preterm AB Living  1 1 1     1   SAB TAB Ectopic Multiple Live Births          1    # Outcome Date GA Lbr Len/2nd Weight Sex Delivery Anes PTL Lv  1 Term 03/03/08 [id number]  8 lb 1 oz (3.657 kg) F Vag-Spont None  LIV      Past Medical History:  Diagnosis Date  . Asthma    as a child  . Bacterial vaginosis   . Headache(784.0)   . Herpes genitalis     Past Surgical History:  Procedure Laterality Date  . NO PAST SURGERIES       Current Outpatient Prescriptions:  .  chlorhexidine (PERIDEX) 0.12 % solution, Use as directed 15 mLs in the mouth or throat 2 (two) times daily. Swish and spit, Disp: 120 mL, Rfl: 0 .  levonorgestrel (PLAN B) 0.75 MG tablet, Take 1 tablet (0.75 mg total) by mouth  every 12 (twelve) hours., Disp: 2 tablet, Rfl: 0 .  metroNIDAZOLE (FLAGYL) 500 MG tablet, Take 1 tablet (500 mg total) by mouth 2 (two) times daily., Disp: 14 tablet, Rfl: 2 .  naproxen (NAPROSYN) 375 MG tablet, Take 1 tablet (375 mg total) by mouth 2 (two) times daily., Disp: 20 tablet, Rfl: 0 .  Norethindrone Acetate-Ethinyl Estrad-FE (LOESTRIN 24 FE) 1-20 MG-MCG(24) tablet, Take 1 tablet by mouth daily., Disp: 1 Package, Rfl: 11 .  Norethindrone-Ethinyl Estradiol-Fe Biphas (LO LOESTRIN FE) 1 MG-10 MCG / 10 MCG tablet, Take 1 tablet by mouth daily., Disp: 1 Package, Rfl: 11 .  clindamycin (CLEOCIN) 300 MG capsule, Take 1 capsule (300 mg total) by mouth 3 (three) times daily., Disp: 21 capsule, Rfl: 1 .  fluconazole (DIFLUCAN) 150 MG tablet, Take 1 tablet (150 mg total) by mouth once., Disp: 1 tablet, Rfl: 2 .  ondansetron (ZOFRAN) 4 MG tablet, Take 1 tablet (4 mg total) by mouth every 6 (six) hours. (Patient not taking: Reported on 04/23/2016), Disp: 12 tablet, Rfl: 0 .  penicillin v potassium (VEETID) 500 MG tablet, Take 1 tablet (500 mg total) by mouth 4 (four) times daily., Disp: 40 tablet, Rfl: 0 No Known Allergies  Social History  Substance Use Topics  . Smoking status: Never Smoker  . Smokeless tobacco: Never Used  . Alcohol use No  Family History  Problem Relation Age of Onset  . Depression Mother   . Diabetes Father   . Hypertension Father   . Depression Brother   . Kidney disease Maternal Grandmother   . Kidney disease Paternal Grandmother       Review of Systems  Constitutional: negative for fatigue and weight loss Respiratory: negative for cough and wheezing Cardiovascular: negative for chest pain, fatigue and palpitations Gastrointestinal: negative for abdominal pain and change in bowel habits Musculoskeletal:negative for myalgias Neurological: negative for gait problems and tremors Behavioral/Psych: negative for abusive relationship, depression Endocrine: negative  for temperature intolerance    Genitourinary:negative for abnormal menstrual periods, genital lesions, hot flashes, sexual problems and vaginal discharge Integument/breast: negative for breast lump, breast tenderness, nipple discharge and skin lesion(s)    Objective:       BP 105/68   Pulse 65   Temp 98.7 F (37.1 C) (Oral)   Wt 127 lb 1.6 oz (57.7 kg)   LMP 04/01/2016 (Exact Date)   BMI 19.91 kg/m  General:   alert  Skin:   no rash or abnormalities  Lungs:   clear to auscultation bilaterally  Heart:   regular rate and rhythm, S1, S2 normal, no murmur, click, rub or gallop  Breasts:   normal without suspicious masses, skin or nipple changes or axillary nodes  Abdomen:  normal findings: no organomegaly, soft, non-tender and no hernia  Pelvis:  External genitalia: normal general appearance Urinary system: urethral meatus normal and bladder without fullness, nontender Vaginal: normal without tenderness, induration or masses Cervix: normal appearance Adnexa: normal bimanual exam Uterus: anteverted and non-tender, normal size   Lab Review Urine pregnancy test Labs reviewed yes Radiologic studies reviewed no  50% of 15 min visit spent on counseling and coordination of care.    Assessment:    Healthy female exam.    BV   Plan:    Clindamycin Rx for BV  Education reviewed: calcium supplements, low fat, low cholesterol diet, safe sex/STD prevention, self breast exams and weight bearing exercise. Contraception: none. Follow up in: 1 year.   Meds ordered this encounter  Medications  . clindamycin (CLEOCIN) 300 MG capsule    Sig: Take 1 capsule (300 mg total) by mouth 3 (three) times daily.    Dispense:  21 capsule    Refill:  1  . fluconazole (DIFLUCAN) 150 MG tablet    Sig: Take 1 tablet (150 mg total) by mouth once.    Dispense:  1 tablet    Refill:  2   Orders Placed This Encounter  Procedures  . HIV antibody  . Hepatitis B surface antigen  . RPR  . Hepatitis C  antibody

## 2016-04-24 LAB — HEPATITIS C ANTIBODY

## 2016-04-24 LAB — HIV ANTIBODY (ROUTINE TESTING W REFLEX): HIV Screen 4th Generation wRfx: NONREACTIVE

## 2016-04-24 LAB — HEPATITIS B SURFACE ANTIGEN: HEP B S AG: NEGATIVE

## 2016-04-24 LAB — RPR: RPR Ser Ql: NONREACTIVE

## 2016-04-26 LAB — CYTOLOGY - PAP: Diagnosis: NEGATIVE

## 2016-04-28 ENCOUNTER — Other Ambulatory Visit: Payer: Self-pay | Admitting: Obstetrics

## 2016-04-28 LAB — NUSWAB VG+, CANDIDA 6SP
Atopobium vaginae: HIGH Score — AB
BVAB 2: HIGH {score} — AB
CANDIDA ALBICANS, NAA: NEGATIVE
CANDIDA LUSITANIAE, NAA: NEGATIVE
CHLAMYDIA TRACHOMATIS, NAA: NEGATIVE
Candida glabrata, NAA: NEGATIVE
Candida krusei, NAA: NEGATIVE
Candida parapsilosis, NAA: NEGATIVE
Candida tropicalis, NAA: NEGATIVE
MEGASPHAERA 1: HIGH {score} — AB
NEISSERIA GONORRHOEAE, NAA: NEGATIVE
Trich vag by NAA: NEGATIVE

## 2016-06-10 ENCOUNTER — Telehealth: Payer: Self-pay

## 2016-06-10 DIAGNOSIS — B379 Candidiasis, unspecified: Secondary | ICD-10-CM

## 2016-06-10 MED ORDER — FLUCONAZOLE 150 MG PO TABS: 150.0000 mg | ORAL_TABLET | Freq: Once | ORAL | 0 refills | Status: AC

## 2016-06-10 NOTE — Telephone Encounter (Signed)
Returned call, patient wanted lab results, rx sent and to schedule an appt. Sent patient rx per provider approval, and transferred to scheduler.

## 2016-06-24 ENCOUNTER — Ambulatory Visit: Payer: Medicaid Other | Admitting: Obstetrics

## 2016-07-22 ENCOUNTER — Ambulatory Visit: Payer: Medicaid Other | Admitting: Obstetrics

## 2017-01-10 ENCOUNTER — Ambulatory Visit: Payer: Self-pay | Admitting: Obstetrics

## 2017-01-11 ENCOUNTER — Encounter: Payer: Self-pay | Admitting: Obstetrics

## 2017-01-11 ENCOUNTER — Other Ambulatory Visit (HOSPITAL_COMMUNITY)
Admission: RE | Admit: 2017-01-11 | Discharge: 2017-01-11 | Disposition: A | Discharge status: Home / Self Care | Payer: Medicaid Other | Source: Ambulatory Visit | Attending: Obstetrics | Admitting: Obstetrics

## 2017-01-11 ENCOUNTER — Ambulatory Visit (INDEPENDENT_AMBULATORY_CARE_PROVIDER_SITE_OTHER): Payer: Medicaid Other | Admitting: Obstetrics

## 2017-01-11 VITALS — BP 113/73 | HR 71 | Wt 119.4 lb

## 2017-01-11 DIAGNOSIS — Z01419 Encounter for gynecological examination (general) (routine) without abnormal findings: Secondary | ICD-10-CM

## 2017-01-11 DIAGNOSIS — Z113 Encounter for screening for infections with a predominantly sexual mode of transmission: Secondary | ICD-10-CM | POA: Insufficient documentation

## 2017-01-11 DIAGNOSIS — Z0000 Encounter for general adult medical examination without abnormal findings: Secondary | ICD-10-CM | POA: Diagnosis not present

## 2017-01-11 DIAGNOSIS — N898 Other specified noninflammatory disorders of vagina: Secondary | ICD-10-CM | POA: Diagnosis present

## 2017-01-11 DIAGNOSIS — Z3009 Encounter for other general counseling and advice on contraception: Secondary | ICD-10-CM

## 2017-01-11 DIAGNOSIS — Z30011 Encounter for initial prescription of contraceptive pills: Secondary | ICD-10-CM

## 2017-01-11 MED ORDER — LO LOESTRIN FE 1 MG-10 MCG / 10 MCG PO TABS: 1.0000 | ORAL_TABLET | Freq: Every day | ORAL | 4 refills | Status: DC

## 2017-01-11 NOTE — Progress Notes (Signed)
Subjective:        Felicia Castillo is a 26 y.o. female here for a routine exam.  Current complaints: Periods are long.    Personal health questionnaire:  Is patient Ashkenazi Jewish, have a family history of breast and/or ovarian cancer: no Is there a family history of uterine cancer diagnosed at age < 61, gastrointestinal cancer, urinary tract cancer, family member who is a Personnel officer syndrome-associated carrier: no Is the patient overweight and hypertensive, family history of diabetes, personal history of gestational diabetes, preeclampsia or PCOS: no Is patient over 67, have PCOS,  family history of premature CHD under age 8, diabetes, smoke, have hypertension or peripheral artery disease:  no At any time, has a partner hit, kicked or otherwise hurt or frightened you?: no Over the past 2 weeks, have you felt down, depressed or hopeless?: no Over the past 2 weeks, have you felt little interest or pleasure in doing things?:no   Gynecologic History Patient's last menstrual period was 12/31/2016 (exact date). Contraception: abstinence Last Pap: 2017. Results were: normal Last mammogram: n/a. Results were: n/a  Obstetric History OB History  Gravida Para Term Preterm AB Living  1 1 1     1   SAB TAB Ectopic Multiple Live Births          1    # Outcome Date GA Lbr Len/2nd Weight Sex Delivery Anes PTL Lv  1 Term 03/03/08 [redacted]w[redacted]d  8 lb 1 oz (3.657 kg) F Vag-Spont None  LIV      Past Medical History:  Diagnosis Date  . Asthma    as a child  . Bacterial vaginosis   . Headache(784.0)   . Herpes genitalis     Past Surgical History:  Procedure Laterality Date  . NO PAST SURGERIES       Current Outpatient Prescriptions:  .  LO LOESTRIN FE 1 MG-10 MCG / 10 MCG tablet, Take 1 tablet by mouth daily., Disp: 3 Package, Rfl: 4 No Known Allergies  Social History  Substance Use Topics  . Smoking status: Never Smoker  . Smokeless tobacco: Never Used  . Alcohol use No    Family  History  Problem Relation Age of Onset  . Depression Mother   . Diabetes Father   . Hypertension Father   . Depression Brother   . Kidney disease Maternal Grandmother   . Kidney disease Paternal Grandmother       Review of Systems  Constitutional: negative for fatigue and weight loss Respiratory: negative for cough and wheezing Cardiovascular: negative for chest pain, fatigue and palpitations Gastrointestinal: negative for abdominal pain and change in bowel habits Musculoskeletal:negative for myalgias Neurological: negative for gait problems and tremors Behavioral/Psych: negative for abusive relationship, depression Endocrine: negative for temperature intolerance    Genitourinary:negative for abnormal menstrual periods, genital lesions, hot flashes, sexual problems and vaginal discharge Integument/breast: negative for breast lump, breast tenderness, nipple discharge and skin lesion(s)    Objective:       BP 113/73   Pulse 71   Wt 119 lb 6.4 oz (54.2 kg)   LMP 12/31/2016 (Exact Date)   BMI 18.70 kg/m  General:   alert  Skin:   no rash or abnormalities  Lungs:   clear to auscultation bilaterally  Heart:   regular rate and rhythm, S1, S2 normal, no murmur, click, rub or gallop  Breasts:   normal without suspicious masses, skin or nipple changes or axillary nodes  Abdomen:  normal findings: no organomegaly,  soft, non-tender and no hernia  Pelvis:  External genitalia: normal general appearance Urinary system: urethral meatus normal and bladder without fullness, nontender Vaginal: normal without tenderness, induration or masses Cervix: normal appearance Adnexa: normal bimanual exam Uterus: anteverted and non-tender, normal size   Lab Review Urine pregnancy test Labs reviewed yes Radiologic studies reviewed no  50% of 20 min visit spent on counseling and coordination of care.    Assessment:     1. Encounter for gynecological examination with Papanicolaou smear of  cervix Rx: - Cytology - PAP  2. Vaginal discharge Rx: - Cervicovaginal ancillary only  3. Encounter for other general counseling and advice on contraception - wants OCP's  4. Encounter for initial prescription of contraceptive pills Rx: - LO LOESTRIN FE 1 MG-10 MCG / 10 MCG tablet; Take 1 tablet by mouth daily.  Dispense: 3 Package; Refill: 4  5. Screen for STD (sexually transmitted disease) Rx: - HIV antibody - Hepatitis B surface antigen - RPR - Hepatitis C antibody   Plan:    Education reviewed: calcium supplements, depression evaluation, low fat, low cholesterol diet, safe sex/STD prevention, self breast exams and weight bearing exercise. Contraception: OCP (estrogen/progesterone). Follow up in: 1 year.   Meds ordered this encounter  Medications  . LO LOESTRIN FE 1 MG-10 MCG / 10 MCG tablet    Sig: Take 1 tablet by mouth daily.    Dispense:  3 Package    Refill:  4    Submit other coverage code 3  BIN:  [id number]  PCN:  [id number]   GRP:  [id number]   ID:  [id number]   Orders Placed This Encounter  Procedures  . HIV antibody  . Hepatitis B surface antigen  . RPR  . Hepatitis C antibody

## 2017-01-11 NOTE — Progress Notes (Signed)
Patient is in the office for her annual exam.

## 2017-01-12 LAB — CERVICOVAGINAL ANCILLARY ONLY
Bacterial vaginitis: POSITIVE — AB
CANDIDA VAGINITIS: NEGATIVE
CHLAMYDIA, DNA PROBE: NEGATIVE
NEISSERIA GONORRHEA: NEGATIVE
Trichomonas: NEGATIVE

## 2017-01-12 LAB — HEPATITIS B SURFACE ANTIGEN: HEP B S AG: NEGATIVE

## 2017-01-12 LAB — HEPATITIS C ANTIBODY: HEP C VIRUS AB: 0.1 {s_co_ratio} (ref 0.0–0.9)

## 2017-01-12 LAB — HIV ANTIBODY (ROUTINE TESTING W REFLEX): HIV SCREEN 4TH GENERATION: NONREACTIVE

## 2017-01-12 LAB — RPR: RPR Ser Ql: NONREACTIVE

## 2017-01-13 ENCOUNTER — Telehealth: Payer: Self-pay

## 2017-01-13 ENCOUNTER — Other Ambulatory Visit: Payer: Self-pay | Admitting: Obstetrics

## 2017-01-13 ENCOUNTER — Other Ambulatory Visit: Payer: Self-pay

## 2017-01-13 DIAGNOSIS — B3731 Acute candidiasis of vulva and vagina: Secondary | ICD-10-CM

## 2017-01-13 DIAGNOSIS — B373 Candidiasis of vulva and vagina: Secondary | ICD-10-CM

## 2017-01-13 MED ORDER — FLUCONAZOLE 150 MG PO TABS: 150.0000 mg | ORAL_TABLET | Freq: Once | ORAL | 0 refills | Status: AC

## 2017-01-13 MED ORDER — METRONIDAZOLE 500 MG PO TABS: 500.0000 mg | ORAL_TABLET | Freq: Two times a day (BID) | ORAL | 0 refills | Status: DC

## 2017-01-13 NOTE — Telephone Encounter (Signed)
Patient notified of results and RX 

## 2017-01-13 NOTE — Telephone Encounter (Signed)
-----   Message from Brock Bad, MD sent at 01/13/2017  6:18 AM EDT ----- Diflucan Rx for yeast

## 2017-01-14 ENCOUNTER — Other Ambulatory Visit: Payer: Self-pay | Admitting: Obstetrics

## 2017-01-14 LAB — CYTOLOGY - PAP: Diagnosis: NEGATIVE

## 2017-03-13 ENCOUNTER — Other Ambulatory Visit: Payer: Self-pay | Admitting: Obstetrics

## 2017-03-13 DIAGNOSIS — N760 Acute vaginitis: Principal | ICD-10-CM

## 2017-03-13 DIAGNOSIS — B9689 Other specified bacterial agents as the cause of diseases classified elsewhere: Secondary | ICD-10-CM

## 2017-03-14 ENCOUNTER — Other Ambulatory Visit: Payer: Self-pay | Admitting: Obstetrics

## 2017-03-14 DIAGNOSIS — N760 Acute vaginitis: Principal | ICD-10-CM

## 2017-03-14 DIAGNOSIS — B9689 Other specified bacterial agents as the cause of diseases classified elsewhere: Secondary | ICD-10-CM

## 2017-03-14 MED ORDER — SECNIDAZOLE 2 G PO PACK: 1.0000 | PACK | Freq: Once | ORAL | 2 refills | Status: AC

## 2017-03-14 NOTE — Telephone Encounter (Signed)
Solosec Rx for BV Discontinue Clindamycin and Flagyl

## 2017-03-16 ENCOUNTER — Ambulatory Visit: Payer: Self-pay | Admitting: Obstetrics

## 2017-03-16 NOTE — Telephone Encounter (Signed)
Pt aware to discontinue Clindamycin and Flagyl. PA approved for Solosec explained in detail.

## 2017-03-23 ENCOUNTER — Ambulatory Visit: Payer: Self-pay | Admitting: Obstetrics

## 2017-03-29 ENCOUNTER — Ambulatory Visit: Payer: Self-pay | Admitting: Obstetrics

## 2017-06-13 ENCOUNTER — Ambulatory Visit: Payer: Self-pay | Admitting: Obstetrics

## 2017-06-27 ENCOUNTER — Encounter: Payer: Self-pay | Admitting: Obstetrics

## 2017-06-27 ENCOUNTER — Other Ambulatory Visit (HOSPITAL_COMMUNITY)
Admission: RE | Admit: 2017-06-27 | Discharge: 2017-06-27 | Disposition: A | Discharge status: Home / Self Care | Payer: Medicaid Other | Source: Ambulatory Visit | Attending: Obstetrics | Admitting: Obstetrics

## 2017-06-27 ENCOUNTER — Ambulatory Visit: Payer: Medicaid Other | Admitting: Obstetrics

## 2017-06-27 VITALS — BP 113/68 | HR 76 | Wt 132.9 lb

## 2017-06-27 DIAGNOSIS — Z113 Encounter for screening for infections with a predominantly sexual mode of transmission: Secondary | ICD-10-CM

## 2017-06-27 DIAGNOSIS — N898 Other specified noninflammatory disorders of vagina: Secondary | ICD-10-CM | POA: Diagnosis not present

## 2017-06-27 NOTE — Progress Notes (Signed)
Patient is in the office for std screening.

## 2017-06-27 NOTE — Progress Notes (Signed)
Patient ID: Felicia ChaseShadonna D Ruane, female   DOB: 1990-08-30, 27 y.o.   MRN: [id number]  Chief Complaint  Patient presents with  . GYN    HPI Felicia Castillo is a 27 y.o. female.  Requests STD screen. HPI  Past Medical History:  Diagnosis Date  . Asthma    as a child  . Bacterial vaginosis   . Headache(784.0)   . Herpes genitalis     Past Surgical History:  Procedure Laterality Date  . NO PAST SURGERIES      Family History  Problem Relation Age of Onset  . Depression Mother   . Diabetes Father   . Hypertension Father   . Depression Brother   . Kidney disease Maternal Grandmother   . Kidney disease Paternal Grandmother     Social History Social History   Tobacco Use  . Smoking status: Never Smoker  . Smokeless tobacco: Never Used  Substance Use Topics  . Alcohol use: No  . Drug use: No    No Known Allergies  Current Outpatient Medications  Medication Sig Dispense Refill  . LO LOESTRIN FE 1 MG-10 MCG / 10 MCG tablet Take 1 tablet by mouth daily. (Patient not taking: Reported on 06/27/2017) 3 Package 4  . metroNIDAZOLE (FLAGYL) 500 MG tablet Take 1 tablet (500 mg total) by mouth 2 (two) times daily. (Patient not taking: Reported on 06/27/2017) 14 tablet 0   No current facility-administered medications for this visit.     Review of Systems Review of Systems Constitutional: negative for fatigue and weight loss Respiratory: negative for cough and wheezing Cardiovascular: negative for chest pain, fatigue and palpitations Gastrointestinal: negative for abdominal pain and change in bowel habits Genitourinary:negative Integument/breast: negative for nipple discharge Musculoskeletal:negative for myalgias Neurological: negative for gait problems and tremors Behavioral/Psych: negative for abusive relationship, depression Endocrine: negative for temperature intolerance      Blood pressure 113/68, pulse 76, weight 132 lb 14.4 oz (60.3 kg), last menstrual period  05/26/2017.  Physical Exam Physical Exam           General:  Alert and no discharge Abdomen:  normal findings: no organomegaly, soft, non-tender and no hernia  Pelvis:  External genitalia: normal general appearance Urinary system: urethral meatus normal and bladder without fullness, nontender Vaginal: normal without tenderness, induration or masses Cervix: normal appearance Adnexa: normal bimanual exam Uterus: anteverted and non-tender, normal size    50% of 15 min visit spent on counseling and coordination of care.   Data Reviewed Wet Prep  Assessment     1. Screen for STD (sexually transmitted disease) Rx: - HIV antibody - RPR - Hepatitis B Surface AntiGEN - Hepatitis C Antibody  2. Vaginal discharge Rx: - Cervicovaginal ancillary only    Plan    Follow up prn  Orders Placed This Encounter  Procedures  . HIV antibody  . RPR  . Hepatitis B Surface AntiGEN  . Hepatitis C Antibody   No orders of the defined types were placed in this encounter.   Brock BadHARLES A. HARPER MD

## 2017-06-28 LAB — POCT URINALYSIS DIPSTICK
BILIRUBIN UA: NEGATIVE
Blood, UA: NEGATIVE
GLUCOSE UA: NEGATIVE
KETONES UA: NEGATIVE
Leukocytes, UA: NEGATIVE
NITRITE UA: NEGATIVE
PROTEIN UA: NEGATIVE
SPEC GRAV UA: 1.02 (ref 1.010–1.025)
Urobilinogen, UA: 0.2 E.U./dL
pH, UA: 6 (ref 5.0–8.0)

## 2017-06-28 LAB — CERVICOVAGINAL ANCILLARY ONLY
BACTERIAL VAGINITIS: NEGATIVE
Candida vaginitis: NEGATIVE
Chlamydia: NEGATIVE
Neisseria Gonorrhea: NEGATIVE
Trichomonas: NEGATIVE

## 2017-06-28 LAB — RPR: RPR: NONREACTIVE

## 2017-06-28 LAB — HEPATITIS B SURFACE ANTIGEN: HEP B S AG: NEGATIVE

## 2017-06-28 LAB — HEPATITIS C ANTIBODY

## 2017-06-28 LAB — HIV ANTIBODY (ROUTINE TESTING W REFLEX): HIV Screen 4th Generation wRfx: NONREACTIVE

## 2017-06-28 NOTE — Addendum Note (Signed)
Addended by: Natale MilchSTALLING, Jaylanie Boschee D on: 06/28/2017 04:57 PM   Modules accepted: Orders

## 2017-10-07 ENCOUNTER — Inpatient Hospital Stay (HOSPITAL_COMMUNITY)
Admission: AD | Admit: 2017-10-07 | Discharge: 2017-10-08 | Disposition: A | Discharge status: Home / Self Care | Payer: Medicaid Other | Source: Ambulatory Visit | Attending: Obstetrics & Gynecology | Admitting: Obstetrics & Gynecology

## 2017-10-07 ENCOUNTER — Encounter (HOSPITAL_COMMUNITY): Payer: Self-pay | Admitting: Advanced Practice Midwife

## 2017-10-07 DIAGNOSIS — A6004 Herpesviral vulvovaginitis: Secondary | ICD-10-CM | POA: Insufficient documentation

## 2017-10-07 HISTORY — DX: Candidiasis of vulva and vagina: B37.3

## 2017-10-07 HISTORY — DX: Unspecified condition associated with female genital organs and menstrual cycle: N94.9

## 2017-10-07 HISTORY — DX: Candidiasis, unspecified: B37.9

## 2017-10-07 LAB — URINALYSIS, ROUTINE W REFLEX MICROSCOPIC
Bilirubin Urine: NEGATIVE
Glucose, UA: NEGATIVE mg/dL
HGB URINE DIPSTICK: NEGATIVE
Ketones, ur: NEGATIVE mg/dL
LEUKOCYTES UA: NEGATIVE
Nitrite: NEGATIVE
PROTEIN: NEGATIVE mg/dL
SPECIFIC GRAVITY, URINE: 1.026 (ref 1.005–1.030)
pH: 6 (ref 5.0–8.0)

## 2017-10-07 LAB — WET PREP, GENITAL
Clue Cells Wet Prep HPF POC: NONE SEEN
Sperm: NONE SEEN
TRICH WET PREP: NONE SEEN
Yeast Wet Prep HPF POC: NONE SEEN

## 2017-10-07 LAB — POCT PREGNANCY, URINE: PREG TEST UR: NEGATIVE

## 2017-10-07 MED ORDER — ACYCLOVIR 200 MG/5ML PO SUSP: 800.0000 mg | Freq: Three times a day (TID) | ORAL | 1 refills | Status: AC

## 2017-10-07 MED ORDER — VALACYCLOVIR HCL 1 G PO TABS: 1000.0000 mg | ORAL_TABLET | Freq: Every day | ORAL | 0 refills | Status: AC

## 2017-10-07 NOTE — MAU Provider Note (Signed)
History     CSN: [id number]  Arrival date and time: 10/07/17 2154   First Provider Initiated Contact with Patient 10/07/17 2304      Chief Complaint  Patient presents with  . Vaginal Pain   Vaginal Pain  The patient's pertinent negatives include no pelvic pain or vaginal discharge. Primary symptoms comment: vulvar/vaginal pain. This is a new problem. Episode onset: approx 2-3 days ago. The problem occurs constantly. The problem has been unchanged. Pain severity now: 3/10. The problem affects the left side. She is not pregnant. Associated symptoms include dysuria. Pertinent negatives include no chills, fever, frequency, nausea, urgency or vomiting. The vaginal discharge was normal. There has been no bleeding. The symptoms are aggravated by intercourse, tactile pressure and urinating. She has tried nothing for the symptoms. She is sexually active. She uses nothing for contraception. Her menstrual history has been regular (LMP 09/24/17 ). Her past medical history is significant for herpes simplex.    Past Medical History:  Diagnosis Date  . Asthma    as a child  . Bacterial vaginosis   . Candidiasis of vulva and vagina 10/30/2013  . Headache(784.0)   . Herpes genitalis   . Unspecified symptom associated with female genital organs 10/10/2013  . Yeast infection 02/23/2013    Past Surgical History:  Procedure Laterality Date  . NO PAST SURGERIES      Family History  Problem Relation Age of Onset  . Depression Mother   . Diabetes Father   . Hypertension Father   . Depression Brother   . Kidney disease Maternal Grandmother   . Kidney disease Paternal Grandmother     Social History   Tobacco Use  . Smoking status: Never Smoker  . Smokeless tobacco: Never Used  Substance Use Topics  . Alcohol use: No  . Drug use: No    Allergies: No Known Allergies  Medications Prior to Admission  Medication Sig Dispense Refill Last Dose  . LO LOESTRIN FE 1 MG-10 MCG / 10 MCG tablet Take 1  tablet by mouth daily. (Patient not taking: Reported on 06/27/2017) 3 Package 4 Not Taking  . metroNIDAZOLE (FLAGYL) 500 MG tablet Take 1 tablet (500 mg total) by mouth 2 (two) times daily. (Patient not taking: Reported on 06/27/2017) 14 tablet 0 Not Taking    Review of Systems  Constitutional: Negative for chills and fever.  Gastrointestinal: Negative for nausea and vomiting.  Genitourinary: Positive for dysuria and vaginal pain. Negative for frequency, pelvic pain, urgency, vaginal bleeding and vaginal discharge.   Physical Exam   Blood pressure 114/69, pulse 64, temperature 98.4 F (36.9 C), resp. rate 18, height  (1.702 m), weight 126 lb (57.2 kg), last menstrual period 09/24/2017.  Physical Exam  Nursing note and vitals reviewed. Constitutional: She is oriented to person, place, and time. She appears well-developed and well-nourished. No distress.  HENT:  Head: Normocephalic.  Cardiovascular: Normal rate.  Respiratory: Effort normal.  GI: Soft. There is no tenderness.  Genitourinary:  Genitourinary Comments: One small (less than 1cm) herpes lesion on each side of the introitus.   Neurological: She is alert and oriented to person, place, and time.  Skin: Skin is warm and dry.  Psychiatric: She has a normal mood and affect.    Results for orders placed or performed during the hospital encounter of 10/07/17 (from the past 24 hour(s))  Urinalysis, Routine w reflex microscopic     Status: None   Collection Time: 10/07/17 10:15 PM  Result Value  Ref Range   Color, Urine YELLOW YELLOW   APPearance CLEAR CLEAR   Specific Gravity, Urine 1.026 1.005 - 1.030   pH 6.0 5.0 - 8.0   Glucose, UA NEGATIVE NEGATIVE mg/dL   Hgb urine dipstick NEGATIVE NEGATIVE   Bilirubin Urine NEGATIVE NEGATIVE   Ketones, ur NEGATIVE NEGATIVE mg/dL   Protein, ur NEGATIVE NEGATIVE mg/dL   Nitrite NEGATIVE NEGATIVE   Leukocytes, UA NEGATIVE NEGATIVE  Pregnancy, urine POC     Status: None   Collection  Time: 10/07/17 11:03 PM  Result Value Ref Range   Preg Test, Ur NEGATIVE NEGATIVE  Wet prep, genital     Status: Abnormal   Collection Time: 10/07/17 11:10 PM  Result Value Ref Range   Yeast Wet Prep HPF POC NONE SEEN NONE SEEN   Trich, Wet Prep NONE SEEN NONE SEEN   Clue Cells Wet Prep HPF POC NONE SEEN NONE SEEN   WBC, Wet Prep HPF POC MANY (A) NONE SEEN   Sperm NONE SEEN     MAU Course  Procedures  MDM   Assessment and Plan   1. Herpes simplex vulvovaginitis    DC home Comfort measures reviewed  RX: acylovir  TID x 2 days Return to MAU as needed   Follow-up Information    Department, [facility] Follow up.   Contact information: [address] [place] [address] [phone]            Thressa Sheller 10/07/2017, 11:48 PM

## 2017-10-07 NOTE — Discharge Instructions (Signed)
In late 2019, the [facility] will be moving to the [facility] campus. At that time, the MAU (Maternity Admissions Unit), where you are being seen today, will no longer take care of non-pregnant patients. We strongly encourage you to find a doctor's office before that time, so that you can be seen with any GYN concerns, like vaginal discharge, urinary tract infection, etc.. in a timely manner.  In order to make an office visit more convenient, the Center for [facility] at [facility] [place] will be offering evening hours with same-day appointments, walk-in appointments and scheduled appointments available during this time.  Center for [facility] Hours: Monday - 8am - 7:30 pm with walk-in between 4pm- 7:30 pm Tuesday - 8 am - 5 pm (starting 08/23/17 we will be open late and accepting walk-ins from 4pm - 7:30pm) Wednesday - 8 am - 5 pm (starting 11/23/17 we will be open late and accepting walk-ins from 4pm - 7:30pm) Thursday 8 am - 5 pm (starting 02/23/18 we will be open late and accepting walk-ins from 4pm - 7:30pm) Friday 8 am - 5 pm  For an appointment please call the Center for [facility] @ [facility] at [phone]  For urgent needs, [facility] is also available for management of urgent GYN complaints such as vaginal discharge or urinary tract infections.    Genital Herpes Genital herpes is a common sexually transmitted infection (STI) that is caused by a virus. The virus spreads from person to person through sexual contact. Infection can cause itching, blisters, and sores around the genitals or rectum. Symptoms may last several days and then go away This is called an outbreak. However, the virus remains in your body, so you may have more outbreaks in the future. The time between outbreaks varies and can be months or years. Genital herpes affects men and women. It is particularly concerning for pregnant women because the virus can be  passed to the baby during delivery and can cause serious problems. Genital herpes is also a concern for people who have a weak disease-fighting (immune) system. What are the causes? This condition is caused by the herpes simplex virus (HSV) type 1 or type 2. The virus may spread through:  Sexual contact with an infected person, including vaginal, anal, and oral sex.  Contact with fluid from a herpes sore.  The skin. This means that you can get herpes from an infected partner even if he or she does not have a visible sore or does not know that he or she is infected.  What increases the risk? You are more likely to develop this condition if:  You have sex with many partners.  You do not use latex condoms during sex.  What are the signs or symptoms? Most people do not have symptoms (asymptomatic) or have mild symptoms that may be mistaken for other skin problems. Symptoms may include:  Small red bumps near the genitals, rectum, or mouth. These bumps turn into blisters and then turn into sores.  Flu-like symptoms, including: ? Fever. ? Body aches. ? Swollen lymph nodes. ? Headache.  Painful urination.  Pain and itching in the genital area or rectal area.  Vaginal discharge.  Tingling or shooting pain in the legs and buttocks.  Generally, symptoms are more severe and last longer during the first (primary) outbreak. Flu-like symptoms are also more common during the primary outbreak. How is this diagnosed? Genital herpes may be diagnosed based on:  A  physical exam.  Your medical history.  Blood tests.  A test of a fluid sample (culture) from an open sore.  How is this treated? There is no cure for this condition, but treatment with antiviral medicines that are taken by mouth (orally) can do the following:  Speed up healing and relieve symptoms.  Help to reduce the spread of the virus to sexual partners.  Limit the chance of future outbreaks, or make future outbreaks  shorter.  Lessen symptoms of future outbreaks.  Your health care provider may also recommend pain relief medicines, such as aspirin or ibuprofen. Follow these instructions at home: Sexual activity  Do not have sexual contact during active outbreaks.  Practice safe sex. Latex condoms and female condoms may help prevent the spread of the herpes virus. General instructions  Keep the affected areas dry and clean.  Take over-the-counter and prescription medicines only as told by your health care provider.  Avoid rubbing or touching blisters and sores. If you do touch blisters or sores: ? Wash your hands thoroughly with soap and water. ? Do not touch your eyes afterward.  To help relieve pain or itching, you may take the following actions as directed by your health care provider: ? Apply a cold, wet cloth (cold compress) to affected areas 4-6 times a day. ? Apply a substance that protects your skin and reduces bleeding (astringent). ? Apply a gel that helps relieve pain around sores (lidocaine gel). ? Take a warm, shallow bath that cleans the genital area (sitz bath).  Keep all follow-up visits as told by your health care provider. This is important. How is this prevented?  Use condoms. Although anyone can get genital herpes during sexual contact, even with the use of a condom, a condom can provide some protection.  Avoid having multiple sexual partners.  Talk with your sexual partner about any symptoms either of you may have. Also, talk with your partner about any history of STIs.  Get tested for STIs before you have sex. Ask your partner to do the same.  Do not have sexual contact if you have symptoms of genital herpes. Contact a health care provider if:  Your symptoms are not improving with medicine.  Your symptoms return.  You have new symptoms.  You have a fever.  You have abdominal pain.  You have redness, swelling, or pain in your eye.  You notice new sores on  other parts of your body.  You are a woman and experience bleeding between menstrual periods.  You have had herpes and you become pregnant or plan to become pregnant. Summary  Genital herpes is a common sexually transmitted infection (STI) that is caused by the herpes simplex virus (HSV) type 1 or type 2.  These viruses are most often spread through sexual contact with an infected person.  You are more likely to develop this condition if you have sex with many partners or you have unprotected sex.  Most people do not have symptoms (asymptomatic) or have mild symptoms that may be mistaken for other skin problems. Symptoms occur as outbreaks that may happen months or years apart.  There is no cure for this condition, but treatment with oral antiviral medicines can reduce symptoms, reduce the chance of spreading the virus to a partner, prevent future outbreaks, or shorten future outbreaks. This information is not intended to replace advice given to you by your health care provider. Make sure you discuss any questions you have with your health care provider.  Document Released: 05/07/2000 Document Revised: 04/09/2016 Document Reviewed: 04/09/2016 Elsevier Interactive Patient Education  [employer].

## 2017-10-07 NOTE — MAU Note (Addendum)
Sharp vaginal pain for 3 days. Feels like something is "poking" me. No vag d/c. Have hx herpes outbreak but does not feel like that. Feels like a scrape. Hurts more on L side. URine stings the area. May have cut self shaving. Hurts to put in tampon or douche

## 2017-10-10 LAB — GC/CHLAMYDIA PROBE AMP (~~LOC~~) NOT AT ARMC
CHLAMYDIA, DNA PROBE: NEGATIVE
NEISSERIA GONORRHEA: NEGATIVE

## 2017-12-09 ENCOUNTER — Encounter (HOSPITAL_COMMUNITY): Payer: Self-pay

## 2017-12-09 ENCOUNTER — Emergency Department (HOSPITAL_COMMUNITY)
Admission: EM | Admit: 2017-12-09 | Discharge: 2017-12-09 | Disposition: A | Discharge status: Home / Self Care | Payer: Medicaid Other | Attending: Emergency Medicine | Admitting: Emergency Medicine

## 2017-12-09 ENCOUNTER — Emergency Department (HOSPITAL_COMMUNITY): Payer: Medicaid Other

## 2017-12-09 DIAGNOSIS — S0083XA Contusion of other part of head, initial encounter: Secondary | ICD-10-CM | POA: Diagnosis not present

## 2017-12-09 DIAGNOSIS — S0993XA Unspecified injury of face, initial encounter: Secondary | ICD-10-CM | POA: Diagnosis present

## 2017-12-09 DIAGNOSIS — Y929 Unspecified place or not applicable: Secondary | ICD-10-CM | POA: Insufficient documentation

## 2017-12-09 DIAGNOSIS — Y999 Unspecified external cause status: Secondary | ICD-10-CM | POA: Insufficient documentation

## 2017-12-09 DIAGNOSIS — J45909 Unspecified asthma, uncomplicated: Secondary | ICD-10-CM | POA: Insufficient documentation

## 2017-12-09 DIAGNOSIS — Y939 Activity, unspecified: Secondary | ICD-10-CM | POA: Diagnosis not present

## 2017-12-09 MED ORDER — NAPROXEN 375 MG PO TABS: 375.0000 mg | ORAL_TABLET | Freq: Two times a day (BID) | ORAL | 0 refills | Status: DC
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
## 2017-12-09 NOTE — ED Notes (Signed)
Bed: WTR6 Expected date:  Expected time:  Means of arrival:  Comments: Triage Room
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
## 2017-12-09 NOTE — ED Notes (Addendum)
Patient transported to CT 

## 2017-12-09 NOTE — ED Notes (Signed)
Pt updated on plan of care

## 2017-12-09 NOTE — Discharge Instructions (Addendum)
Return as needed for any problems.  °

## 2017-12-09 NOTE — ED Notes (Signed)
Per CT: Pt is next on list

## 2017-12-29 ENCOUNTER — Inpatient Hospital Stay (HOSPITAL_COMMUNITY)
Admission: AD | Admit: 2017-12-29 | Discharge: 2017-12-29 | Disposition: A | Discharge status: Home / Self Care | Payer: Medicaid Other | Source: Ambulatory Visit | Attending: Obstetrics and Gynecology | Admitting: Obstetrics and Gynecology

## 2017-12-29 DIAGNOSIS — Z5321 Procedure and treatment not carried out due to patient leaving prior to being seen by health care provider: Secondary | ICD-10-CM | POA: Diagnosis not present

## 2017-12-29 LAB — URINALYSIS, ROUTINE W REFLEX MICROSCOPIC
Bilirubin Urine: NEGATIVE
Glucose, UA: NEGATIVE mg/dL
Hgb urine dipstick: NEGATIVE
Ketones, ur: NEGATIVE mg/dL
LEUKOCYTES UA: NEGATIVE
Nitrite: NEGATIVE
PROTEIN: NEGATIVE mg/dL
SPECIFIC GRAVITY, URINE: 1.025 (ref 1.005–1.030)
pH: 7 (ref 5.0–8.0)

## 2017-12-29 LAB — POCT PREGNANCY, URINE: PREG TEST UR: NEGATIVE

## 2017-12-29 NOTE — MAU Note (Signed)
Pt not in lobby x2 

## 2017-12-29 NOTE — MAU Note (Signed)
Pt not in lobby x3 

## 2017-12-29 NOTE — MAU Note (Signed)
Pt not in lobby.  

## 2017-12-29 NOTE — MAU Note (Signed)
Patient getting "consistant herpes outbreaks" and "feels like my ph balance is off. I want an STD check."  Pt c/o vaginal itching and irritation, as well as white discharge.  No current outbreak.

## 2018-07-14 ENCOUNTER — Encounter (HOSPITAL_COMMUNITY): Payer: Self-pay | Admitting: Emergency Medicine

## 2018-07-14 ENCOUNTER — Ambulatory Visit (HOSPITAL_COMMUNITY)
Admission: EM | Admit: 2018-07-14 | Discharge: 2018-07-14 | Disposition: A | Discharge status: Home / Self Care | Payer: Medicaid Other | Attending: Family Medicine | Admitting: Family Medicine

## 2018-07-14 DIAGNOSIS — Z202 Contact with and (suspected) exposure to infections with a predominantly sexual mode of transmission: Secondary | ICD-10-CM | POA: Diagnosis not present

## 2018-07-14 DIAGNOSIS — Z709 Sex counseling, unspecified: Secondary | ICD-10-CM

## 2018-07-14 DIAGNOSIS — Z113 Encounter for screening for infections with a predominantly sexual mode of transmission: Secondary | ICD-10-CM | POA: Diagnosis not present

## 2018-07-14 MED ORDER — AZITHROMYCIN 250 MG PO TABS
ORAL_TABLET | ORAL | Status: AC
  Filled 2018-07-14: qty 4

## 2018-07-14 MED ORDER — CEFTRIAXONE SODIUM 250 MG IJ SOLR
250.0000 mg | Freq: Once | INTRAMUSCULAR | Status: AC
  Administered 2018-07-14: 250 mg via INTRAMUSCULAR

## 2018-07-14 MED ORDER — AZITHROMYCIN 250 MG PO TABS
1000.0000 mg | ORAL_TABLET | Freq: Once | ORAL | Status: AC
  Administered 2018-07-14: 1000 mg via ORAL

## 2018-07-14 MED ORDER — CEFTRIAXONE SODIUM 250 MG IJ SOLR
INTRAMUSCULAR | Status: AC
  Filled 2018-07-14: qty 250
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
## 2018-07-14 NOTE — ED Provider Notes (Addendum)
[facility]    CSN: [id number] Arrival date & time: 07/14/18  1328     History   Chief Complaint Chief Complaint  Patient presents with  . Exposure to STD    HPI Felicia Castillo is a 28 y.o. female.   HPI  Patient states that she has 1 sexual partner.  She has been seeing him for the last several months.  He was arrested and incarcerated.  As part of his incarceration intake they did STD testing.  He called from from jail today to states he is positive for gonorrhea.  Patient desires testing and treatment. She does not feel she wants HIV RPR because he had this and was negative Safe sex discussed  Past Medical History:  Diagnosis Date  . Asthma    as a child  . Bacterial vaginosis   . Candidiasis of vulva and vagina 10/30/2013  . Headache(784.0)   . Herpes genitalis   . Unspecified symptom associated with female genital organs 10/10/2013  . Yeast infection 02/23/2013    Patient Active Problem List   Diagnosis Date Noted  . 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 BV (bacterial vaginosis) 10/10/2013  . 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 Abnormal uterine bleeding 06/07/2013  . Unprotected sexual intercourse 06/07/2013  . Screening examination for venereal disease 06/07/2013  . Papanicolaou smear of cervix with low grade squamous intraepithelial lesion (LGSIL) 04/04/2013  . LSIL (low grade squamous intraepithelial lesion) on Pap smear 03/01/2013  . Well woman exam 02/23/2013  . Hemorrhoids 10/12/2012    Past Surgical History:  Procedure Laterality Date  . NO PAST SURGERIES      OB History    Gravida  1   Para  1   Term  1   Preterm      AB      Living  1     SAB      TAB      Ectopic      Multiple      Live Births  1            Home Medications    Prior to Admission medications   Medication Sig Start Date End Date Taking? Authorizing Provider  naproxen (NAPROSYN) 375 MG tablet Take 1  tablet (375 mg total) by mouth 2 (two) times daily. 12/09/17   Janne Napoleon, NP    Family History Family History  Problem Relation Age of Onset  . Depression Mother   . Diabetes Father   . Hypertension Father   . Depression Brother   . Kidney disease Maternal Grandmother   . Kidney disease Paternal Grandmother     Social History Social History   Tobacco Use  . Smoking status: Never Smoker  . Smokeless tobacco: Never Used  Substance Use Topics  . Alcohol use: No  . Drug use: No     Allergies   Patient has no known allergies.   Review of Systems Review of Systems  Constitutional: Negative for chills and fever.  HENT: Negative for ear pain and sore throat.   Eyes: Negative for pain and visual disturbance.  Respiratory: Negative for cough and shortness of breath.   Cardiovascular: Negative for chest pain and palpitations.  Gastrointestinal: Negative for abdominal pain and vomiting.  Genitourinary: Negative for dysuria and hematuria.  Musculoskeletal: Negative for arthralgias and back pain.  Skin: Negative for color change and rash.  Neurological: Negative for seizures and syncope.  All other  systems reviewed and are negative.    Physical Exam Triage Vital Signs ED Triage Vitals [07/14/18 1435]  Enc Vitals Group     BP 124/68     Pulse Rate 61     Resp 16     Temp 97.7 F (36.5 C)     Temp Source Temporal     SpO2 100 %   No data found.  Updated Vital Signs BP 124/68 (BP Location: Right Arm)   Pulse 61   Temp 97.7 F (36.5 C) (Temporal)   Resp 16   LMP 06/07/2018 (Approximate)   SpO2 100%      Physical Exam Constitutional:      General: She is not in acute distress.    Appearance: She is well-developed.  HENT:     Head: Normocephalic and atraumatic.  Eyes:     Conjunctiva/sclera: Conjunctivae normal.     Pupils: Pupils are equal, round, and reactive to light.  Neck:     Musculoskeletal: Normal range of motion.  Cardiovascular:     Rate and  Rhythm: Normal rate.  Pulmonary:     Effort: Pulmonary effort is normal. No respiratory distress.  Abdominal:     General: There is no distension.     Palpations: Abdomen is soft.  Musculoskeletal: Normal range of motion.  Skin:    General: Skin is warm and dry.  Neurological:     Mental Status: She is alert.      UC Treatments / Results  Labs (all labs ordered are listed, but only abnormal results are displayed) Labs Reviewed  CERVICOVAGINAL ANCILLARY ONLY    EKG None  Radiology No results found.  Procedures Procedures (including critical care time)  Medications Ordered in UC Medications  cefTRIAXone (ROCEPHIN) injection 250 mg (250 mg Intramuscular Given 07/14/18 1555)  azithromycin (ZITHROMAX) tablet 1,000 mg (1,000 mg Oral Given 07/14/18 1554)    Initial Impression / Assessment and Plan / UC Course  I have reviewed the triage vital signs and the nursing notes.  Pertinent labs & imaging results that were available during my care of the patient were reviewed by me and considered in my medical decision making (see chart for details).     Final Clinical Impressions(s) / UC Diagnoses   Final diagnoses:  STD exposure     Discharge Instructions     We did lab testing during this visit.  If there are any abnormal findings that require change in medicine or indicate a positive result, you will be notified.  If all of your tests are normal, you will not be called.   No sexual relations until 7 days after treatment    ED Prescriptions    None     Controlled Substance Prescriptions [place] Controlled Substance Registry consulted? Not Applicable   Eustace Moore, MD 07/14/18 1547    Eustace Moore, MD 07/14/18 [phone]

## 2018-07-14 NOTE — ED Triage Notes (Signed)
Pt presents to [facility] for assessment after her sexual partner told her he had gonorrhea.  She states with no results available today she would like to get treated.

## 2018-07-17 LAB — CERVICOVAGINAL ANCILLARY ONLY
CHLAMYDIA, DNA PROBE: NEGATIVE
Neisseria Gonorrhea: NEGATIVE
Trichomonas: NEGATIVE

## 2018-11-12 ENCOUNTER — Emergency Department (HOSPITAL_COMMUNITY)
Admission: EM | Admit: 2018-11-12 | Discharge: 2018-11-12 | Disposition: A | Discharge status: Home / Self Care | Payer: Medicaid Other | Attending: Emergency Medicine | Admitting: Emergency Medicine

## 2018-11-12 ENCOUNTER — Other Ambulatory Visit: Payer: Self-pay

## 2018-11-12 ENCOUNTER — Encounter (HOSPITAL_COMMUNITY): Payer: Self-pay | Admitting: Emergency Medicine

## 2018-11-12 DIAGNOSIS — L0292 Furuncle, unspecified: Secondary | ICD-10-CM | POA: Diagnosis not present

## 2018-11-12 DIAGNOSIS — Z5321 Procedure and treatment not carried out due to patient leaving prior to being seen by health care provider: Secondary | ICD-10-CM | POA: Diagnosis not present

## 2018-11-12 NOTE — ED Triage Notes (Signed)
Pt reports having a boil near pubic area. Pt reports having blood and puss from the boil.

## 2018-12-30 ENCOUNTER — Other Ambulatory Visit: Payer: Self-pay

## 2018-12-30 ENCOUNTER — Emergency Department (HOSPITAL_COMMUNITY)
Admission: EM | Admit: 2018-12-30 | Discharge: 2018-12-30 | Disposition: A | Discharge status: Home / Self Care | Payer: Medicaid Other | Attending: Emergency Medicine | Admitting: Emergency Medicine

## 2018-12-30 DIAGNOSIS — Z113 Encounter for screening for infections with a predominantly sexual mode of transmission: Secondary | ICD-10-CM | POA: Diagnosis not present

## 2018-12-30 DIAGNOSIS — J45909 Unspecified asthma, uncomplicated: Secondary | ICD-10-CM | POA: Diagnosis not present

## 2018-12-30 DIAGNOSIS — Z202 Contact with and (suspected) exposure to infections with a predominantly sexual mode of transmission: Secondary | ICD-10-CM | POA: Diagnosis not present

## 2018-12-30 LAB — URINALYSIS, ROUTINE W REFLEX MICROSCOPIC
Bilirubin Urine: NEGATIVE
Glucose, UA: NEGATIVE mg/dL
Hgb urine dipstick: NEGATIVE
Ketones, ur: NEGATIVE mg/dL
Leukocytes,Ua: NEGATIVE
Nitrite: NEGATIVE
Protein, ur: NEGATIVE mg/dL
Specific Gravity, Urine: 1.026 (ref 1.005–1.030)
pH: 5 (ref 5.0–8.0)

## 2018-12-30 LAB — WET PREP, GENITAL
Sperm: NONE SEEN
Trich, Wet Prep: NONE SEEN
WBC, Wet Prep HPF POC: NONE SEEN
Yeast Wet Prep HPF POC: NONE SEEN

## 2018-12-30 MED ORDER — CEFTRIAXONE SODIUM 250 MG IJ SOLR
250.0000 mg | Freq: Once | INTRAMUSCULAR | Status: AC
  Administered 2018-12-30: 250 mg via INTRAMUSCULAR
  Filled 2018-12-30: qty 250

## 2018-12-30 MED ORDER — AZITHROMYCIN 250 MG PO TABS
1000.0000 mg | ORAL_TABLET | Freq: Once | ORAL | Status: AC
  Administered 2018-12-30: 18:00:00 1000 mg via ORAL
  Filled 2018-12-30: qty 4

## 2018-12-30 NOTE — ED Notes (Signed)
Patient verbalizes understanding of discharge instructions. Opportunity for questioning and answers were provided.  pt discharged from ED. Pt ambulatory by self

## 2018-12-30 NOTE — ED Triage Notes (Signed)
Pt reports her partner may have an STD, so she came to be checked. Pt denies discharge, pain, or any symptoms.

## 2018-12-30 NOTE — ED Provider Notes (Signed)
[place] EMERGENCY DEPARTMENT Provider Note   CSN: [id number] Arrival date & time: 12/30/18  1435     History   Chief Complaint Chief Complaint  Patient presents with  . Exposure to STD    HPI Felicia Castillo is a 28 y.o. female who presents for STD screening. PMH significant for BV, yeast infection, herpes, LGSIL. She states that she had sex with boyfriend last night. This morning he woke up and it hurt when he urinated so he decided to come to the ED. He was treated with a shot and a pill. The patient states that she they have unprotected sex and she has been monogamous. She is requesting testing and treatment today. She denies any symptoms. No fever, abdominal/pelvic pain, N/V, vaginal discharge, bleeding, dyspareunia, or dysuria.      HPI  Past Medical History:  Diagnosis Date  . Asthma    as a child  . Bacterial vaginosis   . Candidiasis of vulva and vagina 10/30/2013  . Headache(784.0)   . Herpes genitalis   . Unspecified symptom associated with female genital organs 10/10/2013  . Yeast infection 02/23/2013    Patient Active Problem List   Diagnosis Date Noted  . 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 BV (bacterial vaginosis) 10/10/2013  . 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 Abnormal uterine bleeding 06/07/2013  . Unprotected sexual intercourse 06/07/2013  . Screening examination for venereal disease 06/07/2013  . Papanicolaou smear of cervix with low grade squamous intraepithelial lesion (LGSIL) 04/04/2013  . LSIL (low grade squamous intraepithelial lesion) on Pap smear 03/01/2013  . Well woman exam 02/23/2013  . Hemorrhoids 10/12/2012    Past Surgical History:  Procedure Laterality Date  . NO PAST SURGERIES       OB History    Gravida  1   Para  1   Term  1   Preterm      AB      Living  1     SAB      TAB      Ectopic      Multiple      Live Births  1            Home  Medications    Prior to Admission medications   Medication Sig Start Date End Date Taking? Authorizing Provider  naproxen (NAPROSYN) 375 MG tablet Take 1 tablet (375 mg total) by mouth 2 (two) times daily. 12/09/17   Ashley Murrain, NP    Family History Family History  Problem Relation Age of Onset  . Depression Mother   . Diabetes Father   . Hypertension Father   . Depression Brother   . Kidney disease Maternal Grandmother   . Kidney disease Paternal Grandmother     Social History Social History   Tobacco Use  . Smoking status: Never Smoker  . Smokeless tobacco: Never Used  Substance Use Topics  . Alcohol use: No  . Drug use: No     Allergies   Patient has no known allergies.   Review of Systems Review of Systems  Gastrointestinal: Negative for vomiting.  Genitourinary: Negative for pelvic pain, vaginal discharge and vaginal pain.     Physical Exam Updated Vital Signs BP 110/65 (BP Location: Left Arm)   Pulse 61   Temp 98.5 F (36.9 C) (Oral)   Resp 18   SpO2 100%   Physical Exam Vitals signs and nursing note reviewed.  Constitutional:  General: She is not in acute distress.    Appearance: Normal appearance. She is well-developed.  HENT:     Head: Normocephalic and atraumatic.  Eyes:     General: No scleral icterus.       Right eye: No discharge.        Left eye: No discharge.     Conjunctiva/sclera: Conjunctivae normal.     Pupils: Pupils are equal, round, and reactive to light.  Neck:     Musculoskeletal: Normal range of motion.  Cardiovascular:     Rate and Rhythm: Normal rate.  Pulmonary:     Effort: Pulmonary effort is normal. No respiratory distress.  Abdominal:     General: There is no distension.  Skin:    General: Skin is warm and dry.  Neurological:     Mental Status: She is alert and oriented to person, place, and time.  Psychiatric:        Behavior: Behavior normal.      ED Treatments / Results  Labs (all labs ordered  are listed, but only abnormal results are displayed) Labs Reviewed  WET PREP, GENITAL - Abnormal; Notable for the following components:      Result Value   Clue Cells Wet Prep HPF POC PRESENT (*)    All other components within normal limits  URINALYSIS, ROUTINE W REFLEX MICROSCOPIC  RPR  HIV ANTIBODY (ROUTINE TESTING W REFLEX)  GC/CHLAMYDIA PROBE AMP ([place]) [facility] AT [facility]    EKG None  Radiology No results found.  Procedures Procedures (including critical care time)  Medications Ordered in ED Medications  cefTRIAXone (ROCEPHIN) injection 250 mg (250 mg Intramuscular Given 12/30/18 1747)  azithromycin (ZITHROMAX) tablet 1,000 mg (1,000 mg Oral Given 12/30/18 1746)     Initial Impression / Assessment and Plan / ED Course  I have reviewed the triage vital signs and the nursing notes.  Pertinent labs & imaging results that were available during my care of the patient were reviewed by me and considered in my medical decision making (see chart for details).  28 year old female presents for testing and treatment for STD after her boyfriend presented earlier today for dysuria.  He was also tested and treated but results are not available.  Patient is asymptomatic.  Urine is normal.  Patient was able to self-swab to obtain wet prep and gonorrhea and chlamydia probe.  Wet prep shows clue cells but otherwise normal.  She was given empiric treatment for gonorrhea and chlamydia and advised she follow-up on results on My chart  Final Clinical Impressions(s) / ED Diagnoses   Final diagnoses:  Possible exposure to STD    ED Discharge Orders    None       Bethel BornGekas, Kannen Moxey Marie, PA-C 12/30/18 [place] [id number]    Dykstra, Richard S, MD 01/01/19 1327

## 2018-12-30 NOTE — Discharge Instructions (Signed)
Do not have sex for 2 weeks after receiving antibiotics If your test is abnormal, you will be called but you have been treated for Gonorrhea and Chlamydia today. You can also review your results on MyChart Practice safe sex and use a condom to prevent infection or unwanted pregnancy Follow up with the Health Department

## 2018-12-31 LAB — HIV ANTIBODY (ROUTINE TESTING W REFLEX): HIV Screen 4th Generation wRfx: NONREACTIVE

## 2018-12-31 LAB — RPR: RPR Ser Ql: NONREACTIVE

## 2019-01-02 ENCOUNTER — Telehealth (HOSPITAL_COMMUNITY): Payer: Self-pay

## 2019-01-02 LAB — GC/CHLAMYDIA PROBE AMP (~~LOC~~) NOT AT ARMC
Chlamydia: POSITIVE — AB
Neisseria Gonorrhea: POSITIVE — AB

## 2019-05-11 ENCOUNTER — Ambulatory Visit: Payer: Medicaid Other | Admitting: Nurse Practitioner

## 2019-07-24 DIAGNOSIS — A6009 Herpesviral infection of other urogenital tract: Secondary | ICD-10-CM | POA: Diagnosis not present

## 2019-07-24 DIAGNOSIS — F329 Major depressive disorder, single episode, unspecified: Secondary | ICD-10-CM | POA: Diagnosis not present

## 2019-07-24 DIAGNOSIS — Z136 Encounter for screening for cardiovascular disorders: Secondary | ICD-10-CM | POA: Diagnosis not present

## 2019-07-24 DIAGNOSIS — E559 Vitamin D deficiency, unspecified: Secondary | ICD-10-CM | POA: Diagnosis not present

## 2019-07-24 DIAGNOSIS — Z131 Encounter for screening for diabetes mellitus: Secondary | ICD-10-CM | POA: Diagnosis not present

## 2019-07-24 DIAGNOSIS — B372 Candidiasis of skin and nail: Secondary | ICD-10-CM | POA: Diagnosis not present

## 2019-07-24 DIAGNOSIS — Z0110 Encounter for examination of ears and hearing without abnormal findings: Secondary | ICD-10-CM | POA: Diagnosis not present

## 2019-07-24 DIAGNOSIS — J302 Other seasonal allergic rhinitis: Secondary | ICD-10-CM | POA: Diagnosis not present

## 2019-07-24 DIAGNOSIS — R7303 Prediabetes: Secondary | ICD-10-CM | POA: Diagnosis not present

## 2019-07-24 DIAGNOSIS — Z113 Encounter for screening for infections with a predominantly sexual mode of transmission: Secondary | ICD-10-CM | POA: Diagnosis not present

## 2019-07-24 DIAGNOSIS — G43909 Migraine, unspecified, not intractable, without status migrainosus: Secondary | ICD-10-CM | POA: Diagnosis not present

## 2019-08-19 ENCOUNTER — Other Ambulatory Visit: Payer: Self-pay

## 2019-08-19 ENCOUNTER — Emergency Department (HOSPITAL_COMMUNITY)
Admission: EM | Admit: 2019-08-19 | Discharge: 2019-08-20 | Disposition: A | Discharge status: Home / Self Care | Payer: Medicaid Other | Attending: Emergency Medicine | Admitting: Emergency Medicine

## 2019-08-19 DIAGNOSIS — Z791 Long term (current) use of non-steroidal anti-inflammatories (NSAID): Secondary | ICD-10-CM | POA: Insufficient documentation

## 2019-08-19 DIAGNOSIS — J45909 Unspecified asthma, uncomplicated: Secondary | ICD-10-CM | POA: Diagnosis not present

## 2019-08-19 DIAGNOSIS — Z202 Contact with and (suspected) exposure to infections with a predominantly sexual mode of transmission: Secondary | ICD-10-CM | POA: Diagnosis not present

## 2019-08-19 NOTE — ED Triage Notes (Signed)
Patient states that her partner was diagnosed with stds. Patient was checked herself on March 2. She said that all her test came back negative. Patient wants to be rechecked. Patient state her partner has HIV, syphilis, gonorrhea, and chlamydia.

## 2019-08-19 NOTE — ED Provider Notes (Signed)
[facility]-EMERGENCY DEPT Provider Note   CSN: [id number] Arrival date & time: 08/19/19  2242     History Chief Complaint  Patient presents with  . Exposure to STD    Felicia Castillo is a 29 y.o. female with a hx of asthma, bacterial vaginosis, yeast vaginitis, genital herpes presents to the Emergency Department complaining of potential exposure to STD.  Patient reports she was last tested for STDs on July 24, 2019 and tests were negative.  Patient reports 2 weeks later her significant other had a genital wound that would not heal.  At that time he was tested for HIV, RPR, gonorrhea and chlamydia.  She reports he tested positive for all 4.  She reports they have continued to be sexually active though have mostly used a condom for the last 2 weeks.  She reports concern about exposure and wishes to have repeat testing.  She denies all vaginal urinary symptoms.  No abdominal pain, fever, chills, back pain.  Patient reports her menstrual cycle is late but she has not taken a pregnancy test.  She is not taking any birth control.   The history is provided by the patient and medical records. No language interpreter was used.       Past Medical History:  Diagnosis Date  . Asthma    as a child  . Bacterial vaginosis   . Candidiasis of vulva and vagina 10/30/2013  . Headache(784.0)   . Herpes genitalis   . Unspecified symptom associated with female genital organs 10/10/2013  . Yeast infection 02/23/2013    Patient Active Problem List   Diagnosis Date Noted  . 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 BV (bacterial vaginosis) 10/10/2013  . 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 Abnormal uterine bleeding 06/07/2013  . Unprotected sexual intercourse 06/07/2013  . Screening examination for venereal disease 06/07/2013  . Papanicolaou smear of cervix with low grade squamous intraepithelial lesion (LGSIL) 04/04/2013  . LSIL (low grade  squamous intraepithelial lesion) on Pap smear 03/01/2013  . Well woman exam 02/23/2013  . Hemorrhoids 10/12/2012    Past Surgical History:  Procedure Laterality Date  . NO PAST SURGERIES       OB History    Gravida  1   Para  1   Term  1   Preterm      AB      Living  1     SAB      TAB      Ectopic      Multiple      Live Births  1           Family History  Problem Relation Age of Onset  . Depression Mother   . Diabetes Father   . Hypertension Father   . Depression Brother   . Kidney disease Maternal Grandmother   . Kidney disease Paternal Grandmother     Social History   Tobacco Use  . Smoking status: Never Smoker  . Smokeless tobacco: Never Used  Substance Use Topics  . Alcohol use: No  . Drug use: No    Home Medications Prior to Admission medications   Medication Sig Start Date End Date Taking? Authorizing Provider  naproxen (NAPROSYN) 375 MG tablet Take 1 tablet (375 mg total) by mouth 2 (two) times daily. 12/09/17   Janne Napoleon, NP    Allergies    Patient has no known allergies.  Review of Systems   Review  of Systems  Constitutional: Negative for appetite change, diaphoresis, fatigue, fever and unexpected weight change.  HENT: Negative for mouth sores.   Eyes: Negative for visual disturbance.  Respiratory: Negative for cough, chest tightness, shortness of breath and wheezing.   Cardiovascular: Negative for chest pain.  Gastrointestinal: Negative for abdominal pain, constipation, diarrhea, nausea and vomiting.  Endocrine: Negative for polydipsia, polyphagia and polyuria.  Genitourinary: Positive for menstrual problem. Negative for dysuria, frequency, hematuria and urgency.  Musculoskeletal: Negative for back pain and neck stiffness.  Skin: Negative for rash.  Allergic/Immunologic: Negative for immunocompromised state.  Neurological: Negative for syncope, light-headedness and headaches.  Hematological: Does not bruise/bleed  easily.  Psychiatric/Behavioral: Negative for sleep disturbance. The patient is not nervous/anxious.     Physical Exam Updated Vital Signs BP 137/76 (BP Location: Left Arm)   Pulse 74   Temp 98.2 F (36.8 C) (Oral)   Resp 16   Ht 5\' 7"  (1.702 m)   Wt 59 kg   LMP 07/15/2019   SpO2 100%   BMI 20.36 kg/m   Physical Exam Vitals and nursing note reviewed.  Constitutional:      General: She is not in acute distress.    Appearance: She is not diaphoretic.  HENT:     Head: Normocephalic.  Eyes:     General: No scleral icterus.    Conjunctiva/sclera: Conjunctivae normal.  Cardiovascular:     Rate and Rhythm: Normal rate and regular rhythm.     Pulses: Normal pulses.          Radial pulses are 2+ on the right side and 2+ on the left side.  Pulmonary:     Effort: No tachypnea, accessory muscle usage, prolonged expiration, respiratory distress or retractions.     Breath sounds: No stridor.     Comments: Equal chest rise. No increased work of breathing. Abdominal:     General: There is no distension.     Palpations: Abdomen is soft.     Tenderness: There is no abdominal tenderness. There is no guarding or rebound.  Musculoskeletal:     Cervical back: Normal range of motion.     Comments: Moves all extremities equally and without difficulty.  Skin:    General: Skin is warm and dry.     Capillary Refill: Capillary refill takes less than 2 seconds.  Neurological:     Mental Status: She is alert.     GCS: GCS eye subscore is 4. GCS verbal subscore is 5. GCS motor subscore is 6.     Comments: Speech is clear and goal oriented.  Psychiatric:        Mood and Affect: Mood normal.     ED Results / Procedures / Treatments   Labs (all labs ordered are listed, but only abnormal results are displayed) Labs Reviewed  WET PREP, GENITAL - Abnormal; Notable for the following components:      Result Value   Clue Cells Wet Prep HPF POC PRESENT (*)    WBC, Wet Prep HPF POC PRESENT (*)      All other components within normal limits  RPR  HIV ANTIBODY (ROUTINE TESTING W REFLEX)  I-STAT BETA HCG BLOOD, ED ([facility], [facility], [facility] ONLY)  GC/CHLAMYDIA PROBE AMP ([place]) NOT AT [facility]    Procedures Procedures (including critical care time)  Medications Ordered in ED Medications - No data to display  ED Course  I have reviewed the triage vital signs and the nursing notes.  Pertinent labs & imaging  results that were available during my care of the patient were reviewed by me and considered in my medical decision making (see chart for details).    MDM Rules/Calculators/A&P                       Patient presents with concerns for STD exposure.  She is asymptomatic at this time.  Will test for HIV, RPR, gonorrhea, chlamydia, trichomonas and pregnancy test.  As patient is currently asymptomatic we will have her self swab.  Wet prep without evidence of trichomonas.  Pregnancy test negative.  Other labs are pending.  Pt presents with concerns for possible STD.  Pt understands that they have HIV, RPR, and GC/Chlamydia cultures pending and that they will need to inform all sexual partners if results return positive.  Additionally they will need treatment if these are positive.  Patient to be discharged with instructions to follow up with OBGYN/PCP. Discussed importance of using protection when sexually active.   Final Clinical Impression(s) / ED Diagnoses Final diagnoses:  Possible exposure to STD    Rx / DC Orders ED Discharge Orders    None       Leily Capek, Boyd Kerbs 08/20/19 0328    Glynn Octave, MD 08/20/19 [phone]

## 2019-08-20 ENCOUNTER — Ambulatory Visit: Payer: Medicaid Other | Admitting: Advanced Practice Midwife

## 2019-08-20 DIAGNOSIS — Z202 Contact with and (suspected) exposure to infections with a predominantly sexual mode of transmission: Secondary | ICD-10-CM | POA: Diagnosis not present

## 2019-08-20 LAB — WET PREP, GENITAL
Sperm: NONE SEEN
Trich, Wet Prep: NONE SEEN
Yeast Wet Prep HPF POC: NONE SEEN

## 2019-08-20 LAB — RPR: RPR Ser Ql: NONREACTIVE

## 2019-08-20 LAB — I-STAT BETA HCG BLOOD, ED (MC, WL, AP ONLY): I-stat hCG, quantitative: <5 m[IU]/mL (ref <5)

## 2019-08-20 LAB — HIV ANTIBODY (ROUTINE TESTING W REFLEX): HIV Screen 4th Generation wRfx: NONREACTIVE

## 2019-08-20 NOTE — Discharge Instructions (Signed)
1. Medications: usual home medications °2. Treatment: rest, drink plenty of fluids, use a condom with every sexual encounter °3. Follow Up: Please followup with your primary doctor in 3 days for discussion of your diagnoses and further evaluation after today's visit; if you do not have a primary care doctor use the resource guide provided to find one; Please return to the ER for worsening symptoms, high fevers or persistent vomiting. ° °You have been tested for HIV, syphilis, chlamydia and gonorrhea.  These results will be available in approximately 3 days.  Please inform all sexual partners if you test positive for any of these diseases. ° °

## 2019-08-21 LAB — GC/CHLAMYDIA PROBE AMP (~~LOC~~) NOT AT ARMC
Chlamydia: NEGATIVE
Neisseria Gonorrhea: NEGATIVE

## 2019-09-06 DIAGNOSIS — F329 Major depressive disorder, single episode, unspecified: Secondary | ICD-10-CM | POA: Diagnosis not present

## 2019-09-06 DIAGNOSIS — Z124 Encounter for screening for malignant neoplasm of cervix: Secondary | ICD-10-CM | POA: Diagnosis not present

## 2019-09-06 DIAGNOSIS — A6009 Herpesviral infection of other urogenital tract: Secondary | ICD-10-CM | POA: Diagnosis not present

## 2019-09-06 DIAGNOSIS — R7303 Prediabetes: Secondary | ICD-10-CM | POA: Diagnosis not present

## 2019-09-06 DIAGNOSIS — E559 Vitamin D deficiency, unspecified: Secondary | ICD-10-CM | POA: Diagnosis not present

## 2019-09-06 DIAGNOSIS — G43909 Migraine, unspecified, not intractable, without status migrainosus: Secondary | ICD-10-CM | POA: Diagnosis not present

## 2019-09-06 DIAGNOSIS — J302 Other seasonal allergic rhinitis: Secondary | ICD-10-CM | POA: Diagnosis not present

## 2019-09-06 DIAGNOSIS — D649 Anemia, unspecified: Secondary | ICD-10-CM | POA: Diagnosis not present

## 2019-11-02 DIAGNOSIS — A6009 Herpesviral infection of other urogenital tract: Secondary | ICD-10-CM | POA: Diagnosis not present

## 2019-11-02 DIAGNOSIS — D508 Other iron deficiency anemias: Secondary | ICD-10-CM | POA: Diagnosis not present

## 2019-11-02 DIAGNOSIS — E559 Vitamin D deficiency, unspecified: Secondary | ICD-10-CM | POA: Diagnosis not present

## 2019-11-02 DIAGNOSIS — F329 Major depressive disorder, single episode, unspecified: Secondary | ICD-10-CM | POA: Diagnosis not present

## 2019-11-02 DIAGNOSIS — G43909 Migraine, unspecified, not intractable, without status migrainosus: Secondary | ICD-10-CM | POA: Diagnosis not present

## 2019-11-02 DIAGNOSIS — R7303 Prediabetes: Secondary | ICD-10-CM | POA: Diagnosis not present

## 2019-11-02 DIAGNOSIS — J302 Other seasonal allergic rhinitis: Secondary | ICD-10-CM | POA: Diagnosis not present

## 2019-11-02 DIAGNOSIS — R21 Rash and other nonspecific skin eruption: Secondary | ICD-10-CM | POA: Diagnosis not present

## 2019-12-20 DIAGNOSIS — J302 Other seasonal allergic rhinitis: Secondary | ICD-10-CM | POA: Diagnosis not present

## 2019-12-20 DIAGNOSIS — A6009 Herpesviral infection of other urogenital tract: Secondary | ICD-10-CM | POA: Diagnosis not present

## 2019-12-20 DIAGNOSIS — E559 Vitamin D deficiency, unspecified: Secondary | ICD-10-CM | POA: Diagnosis not present

## 2019-12-20 DIAGNOSIS — F329 Major depressive disorder, single episode, unspecified: Secondary | ICD-10-CM | POA: Diagnosis not present

## 2019-12-20 DIAGNOSIS — D508 Other iron deficiency anemias: Secondary | ICD-10-CM | POA: Diagnosis not present

## 2019-12-20 DIAGNOSIS — R7303 Prediabetes: Secondary | ICD-10-CM | POA: Diagnosis not present

## 2019-12-20 DIAGNOSIS — Z113 Encounter for screening for infections with a predominantly sexual mode of transmission: Secondary | ICD-10-CM | POA: Diagnosis not present

## 2019-12-20 DIAGNOSIS — R21 Rash and other nonspecific skin eruption: Secondary | ICD-10-CM | POA: Diagnosis not present

## 2019-12-20 DIAGNOSIS — G43909 Migraine, unspecified, not intractable, without status migrainosus: Secondary | ICD-10-CM | POA: Diagnosis not present

## 2019-12-20 DIAGNOSIS — A530 Latent syphilis, unspecified as early or late: Secondary | ICD-10-CM | POA: Diagnosis not present

## 2019-12-27 DIAGNOSIS — N760 Acute vaginitis: Secondary | ICD-10-CM | POA: Diagnosis not present

## 2019-12-27 DIAGNOSIS — A539 Syphilis, unspecified: Secondary | ICD-10-CM | POA: Diagnosis not present

## 2019-12-27 DIAGNOSIS — A5400 Gonococcal infection of lower genitourinary tract, unspecified: Secondary | ICD-10-CM | POA: Diagnosis not present

## 2019-12-27 DIAGNOSIS — Z114 Encounter for screening for human immunodeficiency virus [HIV]: Secondary | ICD-10-CM | POA: Diagnosis not present

## 2019-12-27 DIAGNOSIS — Z113 Encounter for screening for infections with a predominantly sexual mode of transmission: Secondary | ICD-10-CM | POA: Diagnosis not present

## 2019-12-27 DIAGNOSIS — Z3200 Encounter for pregnancy test, result unknown: Secondary | ICD-10-CM | POA: Diagnosis not present

## 2020-01-31 DIAGNOSIS — D508 Other iron deficiency anemias: Secondary | ICD-10-CM | POA: Diagnosis not present

## 2020-02-06 DIAGNOSIS — Z202 Contact with and (suspected) exposure to infections with a predominantly sexual mode of transmission: Secondary | ICD-10-CM | POA: Diagnosis not present

## 2020-02-06 DIAGNOSIS — Z113 Encounter for screening for infections with a predominantly sexual mode of transmission: Secondary | ICD-10-CM | POA: Diagnosis not present

## 2020-02-06 DIAGNOSIS — Z114 Encounter for screening for human immunodeficiency virus [HIV]: Secondary | ICD-10-CM | POA: Diagnosis not present

## 2020-03-19 ENCOUNTER — Emergency Department (HOSPITAL_COMMUNITY)
Admission: EM | Admit: 2020-03-19 | Discharge: 2020-03-20 | Disposition: A | Discharge status: Home / Self Care | Payer: Medicaid Other | Attending: Emergency Medicine | Admitting: Emergency Medicine

## 2020-03-19 ENCOUNTER — Encounter (HOSPITAL_COMMUNITY): Payer: Self-pay

## 2020-03-19 DIAGNOSIS — S0181XA Laceration without foreign body of other part of head, initial encounter: Secondary | ICD-10-CM | POA: Insufficient documentation

## 2020-03-19 DIAGNOSIS — R55 Syncope and collapse: Secondary | ICD-10-CM | POA: Insufficient documentation

## 2020-03-19 DIAGNOSIS — Z5321 Procedure and treatment not carried out due to patient leaving prior to being seen by health care provider: Secondary | ICD-10-CM | POA: Insufficient documentation

## 2020-03-19 DIAGNOSIS — D509 Iron deficiency anemia, unspecified: Secondary | ICD-10-CM | POA: Insufficient documentation

## 2020-03-19 DIAGNOSIS — F329 Major depressive disorder, single episode, unspecified: Secondary | ICD-10-CM | POA: Diagnosis not present

## 2020-03-19 DIAGNOSIS — R42 Dizziness and giddiness: Secondary | ICD-10-CM | POA: Diagnosis not present

## 2020-03-19 DIAGNOSIS — J45909 Unspecified asthma, uncomplicated: Secondary | ICD-10-CM | POA: Insufficient documentation

## 2020-03-19 DIAGNOSIS — S0993XA Unspecified injury of face, initial encounter: Secondary | ICD-10-CM | POA: Diagnosis not present

## 2020-03-19 DIAGNOSIS — J323 Chronic sphenoidal sinusitis: Secondary | ICD-10-CM | POA: Diagnosis not present

## 2020-03-19 DIAGNOSIS — W208XXA Other cause of strike by thrown, projected or falling object, initial encounter: Secondary | ICD-10-CM | POA: Diagnosis not present

## 2020-03-19 LAB — I-STAT BETA HCG BLOOD, ED (MC, WL, AP ONLY): I-stat hCG, quantitative: <5 m[IU]/mL (ref <5)

## 2020-03-19 LAB — CBC
HCT: 30.1 % — ABNORMAL LOW (ref 36.0–46.0)
Hemoglobin: 8.5 g/dL — ABNORMAL LOW (ref 12.0–15.0)
MCH: 17.4 pg — ABNORMAL LOW (ref 26.0–34.0)
MCHC: 28.2 g/dL — ABNORMAL LOW (ref 30.0–36.0)
MCV: 61.6 fL — ABNORMAL LOW (ref 80.0–100.0)
Platelets: 340 10*3/uL (ref 150–400)
RBC: 4.89 MIL/uL (ref 3.87–5.11)
RDW: 19.9 % — ABNORMAL HIGH (ref 11.5–15.5)
WBC: 7.5 10*3/uL (ref 4.0–10.5)
nRBC: 0 % (ref 0.0–0.2)

## 2020-03-19 LAB — BASIC METABOLIC PANEL
Anion gap: 10 (ref 5–15)
BUN: 15 mg/dL (ref 6–20)
CO2: 23 mmol/L (ref 22–32)
Calcium: 9.7 mg/dL (ref 8.9–10.3)
Chloride: 102 mmol/L (ref 98–111)
Creatinine, Ser: 0.74 mg/dL (ref 0.44–1.00)
GFR, Estimated: >60 mL/min (ref >60)
Glucose, Bld: 98 mg/dL (ref 70–99)
Potassium: 3.5 mmol/L (ref 3.5–5.1)
Sodium: 135 mmol/L (ref 135–145)

## 2020-03-19 LAB — URINALYSIS, ROUTINE W REFLEX MICROSCOPIC
Bilirubin Urine: NEGATIVE
Glucose, UA: NEGATIVE mg/dL
Ketones, ur: NEGATIVE mg/dL
Leukocytes,Ua: NEGATIVE
Nitrite: NEGATIVE
Protein, ur: 30 mg/dL — AB
Specific Gravity, Urine: 1.03 (ref 1.005–1.030)
pH: 5 (ref 5.0–8.0)

## 2020-03-19 NOTE — ED Triage Notes (Signed)
Pt reports that she had a syncopal episode today and a pallet fell on her, LOC, small laceration to forehead, pt reports that she has not been eating and drinking over the past few days due to depression, pt denies SI/HI/AVH

## 2020-03-20 ENCOUNTER — Other Ambulatory Visit: Payer: Self-pay

## 2020-03-20 ENCOUNTER — Emergency Department (HOSPITAL_BASED_OUTPATIENT_CLINIC_OR_DEPARTMENT_OTHER): Payer: Medicaid Other

## 2020-03-20 ENCOUNTER — Encounter (HOSPITAL_BASED_OUTPATIENT_CLINIC_OR_DEPARTMENT_OTHER): Payer: Self-pay | Admitting: Emergency Medicine

## 2020-03-20 ENCOUNTER — Emergency Department (HOSPITAL_BASED_OUTPATIENT_CLINIC_OR_DEPARTMENT_OTHER)
Admission: EM | Admit: 2020-03-20 | Discharge: 2020-03-20 | Disposition: A | Discharge status: Home / Self Care | Payer: Medicaid Other | Source: Home / Self Care | Attending: Emergency Medicine | Admitting: Emergency Medicine

## 2020-03-20 DIAGNOSIS — R42 Dizziness and giddiness: Secondary | ICD-10-CM | POA: Insufficient documentation

## 2020-03-20 DIAGNOSIS — D509 Iron deficiency anemia, unspecified: Secondary | ICD-10-CM | POA: Insufficient documentation

## 2020-03-20 DIAGNOSIS — R55 Syncope and collapse: Secondary | ICD-10-CM | POA: Insufficient documentation

## 2020-03-20 DIAGNOSIS — J45909 Unspecified asthma, uncomplicated: Secondary | ICD-10-CM | POA: Insufficient documentation

## 2020-03-20 DIAGNOSIS — S0993XA Unspecified injury of face, initial encounter: Secondary | ICD-10-CM | POA: Diagnosis not present

## 2020-03-20 DIAGNOSIS — J323 Chronic sphenoidal sinusitis: Secondary | ICD-10-CM | POA: Diagnosis not present

## 2020-03-20 LAB — OCCULT BLOOD X 1 CARD TO LAB, STOOL: Fecal Occult Bld: NEGATIVE

## 2020-03-20 NOTE — ED Provider Notes (Signed)
[facility] EMERGENCY DEPARTMENT Provider Note   CSN: [id number] Arrival date & time: 03/20/20  1237     History Chief Complaint  Patient presents with   Loss of Consciousness    Felicia Castillo is a 29 y.o. female.  HPI   Patient with no significant medical history presents to the emergency department with chief complaint of syncopal episode.  Patient endorses that while she was at work yesterday she started to feel dizzy and " passed out".  Patient states she does not remember exactly what happened but states that her coworkers found her on the ground and that she had hit her head on a palate and then fell to the floor.  She states that her coworkers did not see her shake, did not become incontinent or lose control of her bowels, did not bite her tongue.  Patient states she has never had this happen to her before.  She does endorse that over the last 2 days she felt depressed and was not eating or drinking very much and went to work that morning with eating breakfast or drink anything.  She states while she was working she started to feel dizziness and then collapsed.  She denies homicidal or suicidal ideations, denies hallucinations, delusions, and has never tried to hurt herself in the past.  Patient states she is eating and drink without difficulty does not feel depressed anymore.  Patient went to the emergency department yesterday but left due to the long wait lab work was drawn CBC shows no signs of leukocytosis, shows microcytic anemia hemoglobin 8.5, BMP negative for electrolyte abnormalities, no metabolic acidosis, no AKI, no anion gap noted.  Urine analysis negative for nitrates or leukocytes rare bacteria.  Patient denies dark tarry stools or seeing blood in her stools, denies hematuria but does endorse that she recently had her menstrual cycle and states she generally has heavy cycles.  Patient denies fevers, chills, shortness of breath, chest pain, dumping, nausea,  vomiting, diarrhea, pedal edema.  Past Medical History:  Diagnosis Date   Asthma    as a child   Bacterial vaginosis    Candidiasis of vulva and vagina 10/30/2013   Headache(784.0)    Herpes genitalis    Unspecified symptom associated with female genital organs 10/10/2013   Yeast infection 02/23/2013    Patient Active Problem List   Diagnosis Date Noted   Moderate dysplasia of cervix 10/30/2013   BV (bacterial vaginosis) 10/10/2013   LGSIL (low grade squamous intraepithelial dysplasia) 10/04/2013   Abnormal uterine bleeding 06/07/2013   Unprotected sexual intercourse 06/07/2013   Screening examination for venereal disease 06/07/2013   Papanicolaou smear of cervix with low grade squamous intraepithelial lesion (LGSIL) 04/04/2013   LSIL (low grade squamous intraepithelial lesion) on Pap smear 03/01/2013   Well woman exam 02/23/2013   Hemorrhoids 10/12/2012    Past Surgical History:  Procedure Laterality Date   NO PAST SURGERIES       OB History    Gravida  1   Para  1   Term  1   Preterm      AB      Living  1     SAB      TAB      Ectopic      Multiple      Live Births  1           Family History  Problem Relation Age of Onset   Depression Mother  Diabetes Father    Hypertension Father    Depression Brother    Kidney disease Maternal Grandmother    Kidney disease Paternal Grandmother     Social History   Tobacco Use   Smoking status: Never Smoker   Smokeless tobacco: Never Used  Building services engineer Use: Never used  Substance Use Topics   Alcohol use: No   Drug use: No    Home Medications Prior to Admission medications   Medication Sig Start Date End Date Taking? Authorizing Provider  naproxen (NAPROSYN) 375 MG tablet Take 1 tablet (375 mg total) by mouth 2 (two) times daily. Patient not taking: Reported on 03/19/2020 12/09/17   Janne Napoleon, NP    Allergies    Amoxicillin  Review of Systems     Review of Systems  Constitutional: Negative for chills and fever.  HENT: Negative for congestion, tinnitus, trouble swallowing and voice change.   Eyes: Negative for visual disturbance.  Respiratory: Negative for cough and shortness of breath.   Cardiovascular: Negative for chest pain.  Gastrointestinal: Negative for abdominal pain, diarrhea and nausea.  Genitourinary: Negative for dysuria and enuresis.  Musculoskeletal: Negative for back pain.  Skin: Negative for rash.  Neurological: Positive for headaches. Negative for dizziness.  Hematological: Does not bruise/bleed easily.    Physical Exam Updated Vital Signs BP 111/73 (BP Location: Right Arm)    Pulse (!) 57    Temp 98 F (36.7 C) (Oral)    Resp 18    Ht 5\' 7"  (1.702 m)    Wt 56.8 kg    LMP 03/10/2020    SpO2 100%    BMI 19.61 kg/m   Physical Exam Vitals and nursing note reviewed. Exam conducted with a chaperone present.  Constitutional:      General: She is not in acute distress.    Appearance: Normal appearance. She is not ill-appearing or diaphoretic.  HENT:     Head: Normocephalic and atraumatic.     Nose: No congestion or rhinorrhea.     Mouth/Throat:     Mouth: Mucous membranes are moist.     Pharynx: Oropharynx is clear.  Eyes:     General: No visual field deficit or scleral icterus.    Conjunctiva/sclera: Conjunctivae normal.     Pupils: Pupils are equal, round, and reactive to light.  Cardiovascular:     Rate and Rhythm: Normal rate and regular rhythm.     Pulses: Normal pulses.     Heart sounds: No murmur heard.  No friction rub. No gallop.   Pulmonary:     Effort: Pulmonary effort is normal. No respiratory distress.     Breath sounds: No wheezing, rhonchi or rales.  Abdominal:     General: There is no distension.     Palpations: Abdomen is soft.     Tenderness: There is no abdominal tenderness. There is no right CVA tenderness, left CVA tenderness or guarding.  Genitourinary:    Rectum: Normal. Guaiac  result negative.     Comments: With chaperone present digital rectal exam was performed, no abnormalities noted around the outside of the rectum, no stool burden noted, Hemoccult was negative Musculoskeletal:        General: No swelling or tenderness.     Cervical back: Normal range of motion.     Right lower leg: No edema.     Left lower leg: No edema.     Comments: Patient spine was visualized, no gross abnormalities noted.  Spine  was palpated nontender to palpation, no step-off, crepitus, deformities noted.  Patient had 5 of 5 strength, full range of motion, neurovascular intact in all 4 extremities.  Skin:    General: Skin is warm and dry.     Findings: No rash.  Neurological:     General: No focal deficit present.     Mental Status: She is alert and oriented to person, place, and time.     GCS: GCS eye subscore is 4. GCS verbal subscore is 5. GCS motor subscore is 6.     Cranial Nerves: Cranial nerves are intact. No cranial nerve deficit or facial asymmetry.     Sensory: Sensation is intact. No sensory deficit.     Motor: Motor function is intact. No weakness or pronator drift.     Coordination: Coordination is intact. Romberg sign negative. Finger-Nose-Finger Test and [facility]el to [facility] normal.  Psychiatric:        Mood and Affect: Mood normal.     ED Results / Procedures / Treatments   Labs (all labs ordered are listed, but only abnormal results are displayed) Labs Reviewed  OCCULT BLOOD X 1 CARD TO LAB, STOOL  POC OCCULT BLOOD, ED    EKG None  Radiology CT Head Wo Contrast  Result Date: 03/20/2020 CLINICAL DATA:  Facial trauma. Additional provided: Syncopal episode at work with head trauma, intermittent dizziness, laceration to right forehead. EXAM: CT HEAD WITHOUT CONTRAST TECHNIQUE: Contiguous axial images were obtained from the base of the skull through the vertex without intravenous contrast. COMPARISON:  Maxillofacial CT 12/09/2017. FINDINGS: Brain: There is no  acute intracranial hemorrhage. No demarcated cortical infarct. No extra-axial fluid collection. No evidence of intracranial mass. No midline shift. Vascular: No hyperdense vessel. Skull: Normal. Negative for fracture or focal lesion. Sinuses/Orbits: Visualized orbits show no acute finding. Extensive partial opacification of the right sphenoid sinus. No significant mastoid effusion. IMPRESSION: No evidence of acute intracranial abnormality. Right sphenoid sinusitis. Electronically Signed   By: Jackey LogeKyle  Golden DO   On: 03/20/2020 13:42    Procedures Procedures (including critical care time)  Medications Ordered in ED Medications - No data to display  ED Course  I have reviewed the triage vital signs and the nursing notes.  Pertinent labs & imaging results that were available during my care of the patient were reviewed by me and considered in my medical decision making (see chart for details).    MDM Rules/Calculators/A&P                          I have personally reviewed all imaging, labs and have interpreted them.  Patient presents after having a syncopal episode.  She is alert, did not appear acute distress, vital signs reassuring.  Will order Hemoccult and CT head for further evaluation.  CT head does not reveal any acute findings. Hemoccult was negative. Orthostatics negative.  Low suspicion for CVA or intracranial head bleed as patient denies change in vision, paresthesias or weakness in the upper or lower extremities, no neuro deficits noted on exam, CT head does not reveal any acute findings.  Low suspicion for CVA or arrhythmia as patient denies chest pain, shortness of breath, no history of cardiac abnormalities, EKG sinus without signs of ischemia no signs of hypoperfusion fluid overload noted on exam.  Low suspicion for systemic infection as patient is nontoxic-appearing, vital signs reassuring, no obvious source infection on exam.  Low suspicion for seizure as there is  no trauma  noted to the tongue, patient did not lose control of her bowels or urine, she is not shaking during her syncopal episode.  Low suspicion for GI bleeding as Hemoccult was negative.  I suspect patient has normocytic anemia secondary to menstrual cycles.  I suspect patient had a vasovagal syncope.  Will recommend she stays hydrated, eat frequent snacks and that she follows up with her PCP for further evaluation management.  Vital signs have remained stable, no indication for hospital admission.  .  Patient given at home care as well strict return precautions.  Patient verbalized that they understood agreed to said plan.  Final Clinical Impression(s) / ED Diagnoses Final diagnoses:  Syncope and collapse  Iron deficiency anemia, unspecified iron deficiency anemia type    Rx / DC Orders ED Discharge Orders    None       Carroll Sage, PA-C 03/20/20 1430    Melene Plan, DO 03/20/20 1442

## 2020-03-20 NOTE — ED Triage Notes (Signed)
Pt reports syncopal episode yesterday and a pallet fell on her. Small lac to forehead and head pain. Pt went to [facility] ED yesterday but left before seeing the Dr. She had labs, UA, and EKG yesterday.

## 2020-03-20 NOTE — Discharge Instructions (Addendum)
Seen here after a fall.  Lab work and imaging looks reassuring.  I recommend staying hydrated and eating snacks throughout the day.  Please slowly go from a seated position to a standing position having 3 points of contact at all times.  If you start to feel dizzy please sit down and eat or drink something.  You have low hemoglobin I recommend following up with your PCP for further evaluation management.  Come back to the emergency department if you develop chest pain, shortness of breath, severe abdominal pain, uncontrolled nausea, vomiting, diarrhea.

## 2020-03-20 NOTE — ED Notes (Signed)
Pt. To CT scan.

## 2020-03-20 NOTE — ED Notes (Signed)
Pt checked out AMA. 

## 2020-03-21 ENCOUNTER — Telehealth: Payer: Self-pay

## 2020-03-21 NOTE — Telephone Encounter (Signed)
Transition Care Management Follow-up Telephone Call  Date of discharge and from where: 03/20/[address]   How have you been since you were released from the hospital? Doing well  Any questions or concerns? No  Items Reviewed:  Did the pt receive and understand the discharge instructions provided? Yes   Medications obtained and verified? Yes   Other? No   Any new allergies since your discharge? No   Dietary orders reviewed? Yes  Do you have support at home? Yes   Home Care and Equipment/Supplies: Were home health services ordered? not applicable If so, what is the name of the agency?   Has the agency set up a time to come to the patient's home? not applicable Were any new equipment or medical supplies ordered?  No What is the name of the medical supply agency?  Were you able to get the supplies/equipment? not applicable Do you have any questions related to the use of the equipment or supplies? No   Functional Questionnaire: (I = Independent and D = Dependent) ADLs: I  Bathing/Dressing- I  Meal Prep- I  Eating- I  Maintaining continence- I  Transferring/Ambulation- I  Managing Meds- I   Follow up appointments reviewed:   PCP Hospital f/u appt confirmed? Yes  Scheduled to see Norva Riffle 11/02  Specialist Hospital f/u appt confirmed? No    Are transportation arrangements needed? No   If their condition worsens, is the pt aware to call PCP or go to the Emergency Dept.? Yes  Was the patient provided with contact information for the PCP's office or ED? Yes  Was to pt encouraged to call back with questions or concerns? Yes

## 2020-03-26 ENCOUNTER — Other Ambulatory Visit: Payer: Self-pay

## 2020-03-26 ENCOUNTER — Ambulatory Visit (HOSPITAL_COMMUNITY)
Admission: EM | Admit: 2020-03-26 | Discharge: 2020-03-26 | Disposition: A | Discharge status: Home / Self Care | Payer: Medicaid Other | Attending: Family Medicine | Admitting: Family Medicine

## 2020-03-26 ENCOUNTER — Encounter (HOSPITAL_COMMUNITY): Payer: Self-pay

## 2020-03-26 DIAGNOSIS — B360 Pityriasis versicolor: Secondary | ICD-10-CM | POA: Diagnosis not present

## 2020-03-26 MED ORDER — ITRACONAZOLE 100 MG PO CAPS: 200.0000 mg | ORAL_CAPSULE | Freq: Every day | ORAL | 0 refills | Status: DC

## 2020-03-26 NOTE — ED Triage Notes (Addendum)
Pt is here with a rash that has spread all over her body  started March 2021, pt states she has not taken any meds to relieve discomfort. 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

## 2020-03-26 NOTE — ED Provider Notes (Signed)
[facility]ARE [facility]   [id number] 03/26/20 Arrival Time: 1344  ASSESSMENT & PLAN:  1. Tinea versicolor    Has failed topical therapy.  Begin: Meds ordered this encounter  Medications  . itraconazole (SPORANOX) 100 MG capsule    Sig: Take 2 capsules (200 mg total) by mouth daily.    Dispense:  10 capsule    Refill:  0   Patient's last menstrual period was 03/10/2020.  Recommend dermatology f/u if not improving.  Reviewed expectations re: course of current medical issues. Questions answered. Outlined signs and symptoms indicating need for more acute intervention. Patient verbalized understanding. After Visit Summary given.   SUBJECTIVE:  Felicia Castillo is a 29 y.o. female who presents with a skin complaint. Hypopigmented areas over body since March 2021. Tried and failed topical therapy. No itching or pain. Afebrile. No recent illnesses. No specific aggravating or alleviating factors reported.   OBJECTIVE: Vitals:   03/26/20 1430  BP: 124/82  Pulse: (!) 53  Resp: 16  Temp: 98.7 F (37.1 C)  TempSrc: Oral  SpO2: 100%    General appearance: alert; no distress HEENT: [place]; AT Neck: supple with FROM Extremities: no edema; moves all extremities normally Skin: warm and dry; signs of infection: no; coppery brown hypopigmented patches over her extremities and torso; spares face Psychological: alert and cooperative; normal mood and affect  Allergies  Allergen Reactions  . Amoxicillin Rash    Past Medical History:  Diagnosis Date  . Asthma    as a child  . Bacterial vaginosis   . Candidiasis of vulva and vagina 10/30/2013  . Headache(784.0)   . Herpes genitalis   . Unspecified symptom associated with female genital organs 10/10/2013  . Yeast infection 02/23/2013   Social History   Socioeconomic History  . Marital status: Single    Spouse name: Not on file  . Number of children: 1  . Years of education: Not on file  . Highest education level: Not on file    Occupational History  . Not on file  Tobacco Use  . Smoking status: Never Smoker  . Smokeless tobacco: Never Used  Vaping Use  . Vaping Use: Never used  Substance and Sexual Activity  . Alcohol use: No  . Drug use: No  . Sexual activity: Yes    Partners: Male    Birth control/protection: None  Other Topics Concern  . Not on file  Social History Narrative  . Not on file   Social Determinants of Health   Financial Resource Strain:   . Difficulty of Paying Living Expenses: Not on file  Food Insecurity:   . Worried About Programme researcher, broadcasting/film/video in the Last Year: Not on file  . Ran Out of Food in the Last Year: Not on file  Transportation Needs:   . Lack of Transportation (Medical): Not on file  . Lack of Transportation (Non-Medical): Not on file  Physical Activity:   . Days of Exercise per Week: Not on file  . Minutes of Exercise per Session: Not on file  Stress:   . Feeling of Stress : Not on file  Social Connections:   . Frequency of Communication with Friends and Family: Not on file  . Frequency of Social Gatherings with Friends and Family: Not on file  . Attends Religious Services: Not on file  . Active Member of Clubs or Organizations: Not on file  . Attends Banker Meetings: Not on file  . Marital Status: Not on file  Intimate Partner Violence:   . Fear of Current or Ex-Partner: Not on file  . Emotionally Abused: Not on file  . Physically Abused: Not on file  . Sexually Abused: Not on file   Family History  Problem Relation Age of Onset  . Depression Mother   . Diabetes Father   . Hypertension Father   . Depression Brother   . Kidney disease Maternal Grandmother   . Kidney disease Paternal Grandmother    Past Surgical History:  Procedure Laterality Date  . NO PAST SURGERIES       Mardella Layman, MD 03/29/20 1013

## 2020-05-02 DIAGNOSIS — Z113 Encounter for screening for infections with a predominantly sexual mode of transmission: Secondary | ICD-10-CM | POA: Diagnosis not present

## 2020-05-02 DIAGNOSIS — B373 Candidiasis of vulva and vagina: Secondary | ICD-10-CM | POA: Diagnosis not present

## 2020-05-02 DIAGNOSIS — Z114 Encounter for screening for human immunodeficiency virus [HIV]: Secondary | ICD-10-CM | POA: Diagnosis not present

## 2020-05-02 DIAGNOSIS — N760 Acute vaginitis: Secondary | ICD-10-CM | POA: Diagnosis not present

## 2020-05-19 DIAGNOSIS — H5213 Myopia, bilateral: Secondary | ICD-10-CM | POA: Diagnosis not present

## 2020-06-10 DIAGNOSIS — H5213 Myopia, bilateral: Secondary | ICD-10-CM | POA: Diagnosis not present

## 2020-06-11 DIAGNOSIS — R7303 Prediabetes: Secondary | ICD-10-CM | POA: Diagnosis not present

## 2020-06-11 DIAGNOSIS — E559 Vitamin D deficiency, unspecified: Secondary | ICD-10-CM | POA: Diagnosis not present

## 2020-06-11 DIAGNOSIS — B360 Pityriasis versicolor: Secondary | ICD-10-CM | POA: Diagnosis not present

## 2020-06-11 DIAGNOSIS — G43909 Migraine, unspecified, not intractable, without status migrainosus: Secondary | ICD-10-CM | POA: Diagnosis not present

## 2020-06-11 DIAGNOSIS — D508 Other iron deficiency anemias: Secondary | ICD-10-CM | POA: Diagnosis not present

## 2020-07-05 ENCOUNTER — Ambulatory Visit: Payer: Medicaid Other

## 2020-07-16 DIAGNOSIS — Z114 Encounter for screening for human immunodeficiency virus [HIV]: Secondary | ICD-10-CM | POA: Diagnosis not present

## 2020-07-16 DIAGNOSIS — Z113 Encounter for screening for infections with a predominantly sexual mode of transmission: Secondary | ICD-10-CM | POA: Diagnosis not present

## 2020-07-26 ENCOUNTER — Ambulatory Visit: Payer: Medicaid Other

## 2020-08-12 ENCOUNTER — Ambulatory Visit: Payer: Medicaid Other

## 2020-08-13 DIAGNOSIS — E559 Vitamin D deficiency, unspecified: Secondary | ICD-10-CM | POA: Diagnosis not present

## 2020-08-13 DIAGNOSIS — Z136 Encounter for screening for cardiovascular disorders: Secondary | ICD-10-CM | POA: Diagnosis not present

## 2020-08-13 DIAGNOSIS — R7303 Prediabetes: Secondary | ICD-10-CM | POA: Diagnosis not present

## 2020-08-13 DIAGNOSIS — Z1329 Encounter for screening for other suspected endocrine disorder: Secondary | ICD-10-CM | POA: Diagnosis not present

## 2020-08-13 DIAGNOSIS — D508 Other iron deficiency anemias: Secondary | ICD-10-CM | POA: Diagnosis not present

## 2020-08-13 DIAGNOSIS — Z0001 Encounter for general adult medical examination with abnormal findings: Secondary | ICD-10-CM | POA: Diagnosis not present

## 2020-08-13 DIAGNOSIS — G43009 Migraine without aura, not intractable, without status migrainosus: Secondary | ICD-10-CM | POA: Diagnosis not present

## 2020-08-13 DIAGNOSIS — Z131 Encounter for screening for diabetes mellitus: Secondary | ICD-10-CM | POA: Diagnosis not present

## 2020-08-13 DIAGNOSIS — B360 Pityriasis versicolor: Secondary | ICD-10-CM | POA: Diagnosis not present

## 2020-08-21 DIAGNOSIS — Z113 Encounter for screening for infections with a predominantly sexual mode of transmission: Secondary | ICD-10-CM | POA: Diagnosis not present

## 2020-10-03 DIAGNOSIS — Z113 Encounter for screening for infections with a predominantly sexual mode of transmission: Secondary | ICD-10-CM | POA: Diagnosis not present

## 2020-11-27 DIAGNOSIS — Z113 Encounter for screening for infections with a predominantly sexual mode of transmission: Secondary | ICD-10-CM | POA: Diagnosis not present

## 2020-11-27 DIAGNOSIS — Z114 Encounter for screening for human immunodeficiency virus [HIV]: Secondary | ICD-10-CM | POA: Diagnosis not present

## 2021-02-15 ENCOUNTER — Emergency Department (HOSPITAL_COMMUNITY)
Admission: EM | Admit: 2021-02-15 | Discharge: 2021-02-15 | Disposition: A | Discharge status: Home / Self Care | Payer: Medicaid Other | Attending: Emergency Medicine | Admitting: Emergency Medicine

## 2021-02-15 ENCOUNTER — Other Ambulatory Visit: Payer: Self-pay

## 2021-02-15 ENCOUNTER — Encounter (HOSPITAL_COMMUNITY): Payer: Self-pay | Admitting: Emergency Medicine

## 2021-02-15 DIAGNOSIS — Z20822 Contact with and (suspected) exposure to covid-19: Secondary | ICD-10-CM | POA: Diagnosis not present

## 2021-02-15 DIAGNOSIS — J069 Acute upper respiratory infection, unspecified: Secondary | ICD-10-CM | POA: Diagnosis not present

## 2021-02-15 DIAGNOSIS — R059 Cough, unspecified: Secondary | ICD-10-CM | POA: Diagnosis present

## 2021-02-15 DIAGNOSIS — J45909 Unspecified asthma, uncomplicated: Secondary | ICD-10-CM | POA: Diagnosis not present

## 2021-02-15 LAB — RESP PANEL BY RT-PCR (FLU A&B, COVID) ARPGX2
Influenza A by PCR: NEGATIVE
Influenza B by PCR: NEGATIVE
SARS Coronavirus 2 by RT PCR: NEGATIVE

## 2021-02-15 NOTE — Discharge Instructions (Addendum)
You were seen in the emergency department today for cough, congestion, and sore throat.  We tested for strep, COVID, flu, and RSV.  All of these were negative.  It is likely that your symptoms are due to a different viral respiratory infection.  This is normally treated with over-the-counter medications such as Mucinex, DayQuil, Tylenol, or ibuprofen and should resolve within the next few days.  Because this is likely a virus, you would not benefit from antibiotics which are for bacteria.  I would continue to wear your mask at work until your symptoms resolve.  Continue to monitor how you are doing and return to the emergency department for new or worsening symptoms such as prolonged fever, difficulty breathing, or inability to swallow.

## 2021-02-15 NOTE — ED Triage Notes (Signed)
Patient here from home reporting cough and nasal congestion that started Friday.

## 2021-02-15 NOTE — ED Provider Notes (Signed)
Emergency Medicine Provider Triage Evaluation Note  Felicia Castillo , a 30 y.o. female  was evaluated in triage.  Pt complains of nonproductive cough and nasal congestion that started on Friday.  She recently had teeth pulled on 9/12 and finished a course of clindamycin.  She has been taking no other medications.  Daughter is here with similar symptoms.  Review of Systems  Positive: Cough, congestion Negative: Fever, chills, CP, SOB, N/V, sore throat  Physical Exam  BP 131/78 (BP Location: Right Arm)   Pulse 64   Temp 98.3 F (36.8 C) (Oral)   Resp 16   SpO2 100%  Gen:   Awake, no distress   Resp:  Normal effort  MSK:   Moves extremities without difficulty  Other:    Medical Decision Making  Medically screening exam initiated at 5:12 PM.  Appropriate orders placed.  Frutoso Chase was informed that the remainder of the evaluation will be completed by another provider, this initial triage assessment does not replace that evaluation, and the importance of remaining in the ED until their evaluation is complete.  MSE was initiated and I personally evaluated the patient and placed orders (if any) at  5:13 PM on February 15, 2021.  The patient appears stable so that the remainder of the MSE may be completed by another provider.    Jeanella Flattery 02/15/21 1713    Koleen Distance, MD 02/15/21 [phone]

## 2021-02-15 NOTE — ED Provider Notes (Signed)
[facility]-EMERGENCY DEPT Provider Note   CSN: [id number] Arrival date & time: 02/15/21  1634     History Chief Complaint  Patient presents with   Cough   Nasal Congestion    Felicia Castillo is a 30 y.o. female with no significant past medical history who presents to the emergency department for nonproductive cough and nasal congestion that started on Friday.  Patient states that she has not taken any medications for this.  Her daughter is here with similar symptoms, is doing well.  She denies fevers, chills, chest pain, shortness of breath, nausea, vomiting, or sore throat.   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   Abnormal uterine bleeding 06/07/2013   Unprotected sexual intercourse 06/07/2013   Screening examination for venereal disease 06/07/2013   Papanicolaou smear of cervix with low grade squamous intraepithelial lesion (LGSIL) 04/04/2013   LSIL (low grade squamous intraepithelial lesion) on Pap smear 03/01/2013   Well woman exam 02/23/2013   Hemorrhoids 10/12/2012    Past Surgical History:  Procedure Laterality Date   NO PAST SURGERIES       OB History     Gravida  1   Para  1   Term  1   Preterm      AB      Living  1      SAB      IAB      Ectopic      Multiple      Live Births  1           Family History  Problem Relation Age of Onset   Depression Mother     Diabetes Father    Hypertension Father    Depression Brother    Kidney disease Maternal Grandmother    Kidney disease Paternal Grandmother     Social History   Tobacco Use   Smoking status: Never   Smokeless tobacco: Never  Vaping Use   Vaping Use: Never used  Substance Use Topics   Alcohol use: No   Drug use: No    Home Medications Prior to Admission medications   Medication Sig Start Date End Date Taking? Authorizing Provider  itraconazole (SPORANOX) 100 MG capsule Take 2 capsules (200 mg total) by mouth daily. 03/26/20   Mardella Layman, MD    Allergies    Amoxicillin  Review of Systems   Review of Systems  Constitutional:  Negative for chills and fever.  HENT:  Positive for congestion. Negative for ear pain and sore throat.   Respiratory:  Positive for cough. Negative for shortness of breath.   Cardiovascular:  Negative for chest pain.  Gastrointestinal:  Negative for abdominal pain, nausea and vomiting.  All other systems reviewed and are negative.  Physical Exam Updated Vital Signs BP 131/78 (BP Location: Right Arm)   Pulse 64   Temp 98.3  F (36.8 C) (Oral)   Resp 16   SpO2 100%   Physical Exam Vitals and nursing note reviewed.  Constitutional:      Appearance: Normal appearance.  HENT:     Head: Normocephalic and atraumatic.     Nose: Congestion present.     Mouth/Throat:     Mouth: Mucous membranes are moist.     Pharynx: Oropharynx is clear. No oropharyngeal exudate or posterior oropharyngeal erythema.  Eyes:     Conjunctiva/sclera: Conjunctivae normal.  Cardiovascular:     Rate and Rhythm: Normal rate and regular rhythm.  Pulmonary:     Effort: Pulmonary effort is normal. No respiratory distress.     Breath sounds: Normal breath sounds.  Abdominal:     General: There is no distension.     Palpations: Abdomen is soft.     Tenderness: There is no abdominal tenderness.  Skin:    General: Skin is warm and dry.  Neurological:     General: No  focal deficit present.     Mental Status: She is alert.    ED Results / Procedures / Treatments   Labs (all labs ordered are listed, but only abnormal results are displayed) Labs Reviewed  RESP PANEL BY RT-PCR (FLU A&B, COVID) [id number]    EKG None  Radiology No results found.  Procedures Procedures   Medications Ordered in ED Medications - No data to display  ED Course  I have reviewed the triage vital signs and the nursing notes.  Pertinent labs & imaging results that were available during my care of the patient were reviewed by me and considered in my medical decision making (see chart for details).    MDM Rules/Calculators/A&P                           Patient is 30 year old female who presents with nonproductive cough and nasal congestion that started 2 days ago.  He has not tried any medications for this.  She denies fever, chills, chest pain, shortness of breath, nausea, vomiting, or sore throat.  Testing performed for COVID, flu, and RSV which were all negative.  On exam patient is afebrile, oxygen saturation 100% on room air, in no acute distress.  Lung sounds are clear to auscultation bilaterally.  Oropharynx is moist and clear with no exudate or erythema.  Patient symptoms are likely due to a viral upper respiratory infection.  This is best managed with over-the-counter medications.  Patient is stable to discharge home, given strict return precautions.  Patient agreeable to plan.  Final Clinical Impression(s) / ED Diagnoses Final diagnoses:  Upper respiratory tract infection, unspecified type    Rx / DC Orders ED Discharge Orders     None        Jeanella Flattery 02/15/21 1931    Koleen Distance, MD 02/15/21 2218

## 2021-02-22 ENCOUNTER — Other Ambulatory Visit: Payer: Self-pay

## 2021-02-22 ENCOUNTER — Emergency Department (HOSPITAL_COMMUNITY)
Admission: EM | Admit: 2021-02-22 | Discharge: 2021-02-22 | Disposition: A | Discharge status: Home / Self Care | Payer: Medicaid Other | Attending: Emergency Medicine | Admitting: Emergency Medicine

## 2021-02-22 ENCOUNTER — Encounter (HOSPITAL_COMMUNITY): Payer: Self-pay | Admitting: Emergency Medicine

## 2021-02-22 DIAGNOSIS — J45909 Unspecified asthma, uncomplicated: Secondary | ICD-10-CM | POA: Diagnosis not present

## 2021-02-22 DIAGNOSIS — R0981 Nasal congestion: Secondary | ICD-10-CM

## 2021-02-22 DIAGNOSIS — J019 Acute sinusitis, unspecified: Secondary | ICD-10-CM | POA: Diagnosis not present

## 2021-02-22 MED ORDER — FLUTICASONE PROPIONATE 50 MCG/ACT NA SUSP: 1.0000 | Freq: Every day | NASAL | 0 refills | Status: DC

## 2021-02-22 MED ORDER — OXYMETAZOLINE HCL 0.05 % NA SOLN: 1.0000 | Freq: Two times a day (BID) | NASAL | 0 refills | Status: DC

## 2021-02-22 NOTE — ED Provider Notes (Signed)
[facility]-EMERGENCY DEPT Provider Note   CSN: [id number] Arrival date & time: 02/22/21  1836     History Chief Complaint  Patient presents with   Nasal Congestion    Felicia Castillo is a 30 y.o. female presenting for evaluation of nasal congestion.  Patient states that the past week she has had worsening nasal congestion.  She initially had a cough, this is since resolved.  She has not taken anything for this.  She denies fevers.  No chest pain or shortness of breath.  No sore throat or ear pain.  She states she is having trouble breathing due to the nasal congestion.  She has no other medical problems, takes no medications daily.  HPI     Past Medical History:  Diagnosis Date   Asthma    as a child   Bacterial vaginosis    Candidiasis of vulva and vagina 10/30/2013   Headache(784.0)    Herpes genitalis    Unspecified symptom associated with female genital organs 10/10/2013   Yeast infection 02/23/2013    Patient Active Problem List   Diagnosis Date Noted   Moderate dysplasia of cervix 10/30/2013   BV (bacterial vaginosis) 10/10/2013   LGSIL (low grade squamous intraepithelial dysplasia) 10/04/2013   Abnormal uterine bleeding 06/07/2013   Unprotected sexual intercourse 06/07/2013   Screening examination for venereal disease 06/07/2013   Papanicolaou smear of cervix with low grade squamous intraepithelial lesion (LGSIL) 04/04/2013   LSIL (low grade squamous intraepithelial lesion) on Pap smear 03/01/2013   Well woman exam 02/23/2013   Hemorrhoids 10/12/2012    Past Surgical History:  Procedure Laterality Date   NO PAST SURGERIES       OB History     Gravida  1   Para  1   Term  1   Preterm      AB      Living  1      SAB      IAB      Ectopic      Multiple      Live Births  1           Family History  Problem Relation Age of Onset   Depression Mother    Diabetes Father    Hypertension Father    Depression  Brother    Kidney disease Maternal Grandmother    Kidney disease Paternal Grandmother     Social History   Tobacco Use   Smoking status: Never   Smokeless tobacco: Never  Vaping Use   Vaping Use: Never used  Substance Use Topics   Alcohol use: No   Drug use: No    Home Medications Prior to Admission medications   Medication Sig Start Date End Date Taking? Authorizing Provider  fluticasone (FLONASE) 50 MCG/ACT nasal spray Place 1 spray into both nostrils daily. 02/22/21  Yes Berenis Corter, PA-C  oxymetazoline (AFRIN NASAL SPRAY) 0.05 % nasal spray Place 1 spray into both nostrils 2 (two) times daily. 02/22/21  Yes Deavon Podgorski, PA-C  itraconazole (SPORANOX) 100 MG capsule Take 2 capsules (200 mg total) by mouth daily. 03/26/20   Mardella Layman, MD    Allergies    Amoxicillin  Review of Systems   Review of Systems  HENT:  Positive for congestion.   Respiratory:  Positive for cough.    Physical Exam Updated Vital Signs BP 121/75   Pulse 63   Temp 98.1 F (36.7 C) (Oral)   Resp 16  Ht 5\' 7"  (1.702 m)   Wt 60.4 kg   SpO2 98%   BMI 20.85 kg/m   Physical Exam Vitals and nursing note reviewed.  Constitutional:      General: She is not in acute distress.    Appearance: Normal appearance.     Comments: Resting in the bed in NAD  HENT:     Head: Normocephalic and atraumatic.     Nose: Mucosal edema, congestion and rhinorrhea present. Rhinorrhea is clear.     Comments: Mucosal edema and nasal congestion noted.  No sinus pain or pressure.  Clear rhinorrhea. Eyes:     Conjunctiva/sclera: Conjunctivae normal.     Pupils: Pupils are equal, round, and reactive to light.  Cardiovascular:     Rate and Rhythm: Normal rate and regular rhythm.     Pulses: Normal pulses.  Pulmonary:     Effort: Pulmonary effort is normal. No respiratory distress.     Breath sounds: Normal breath sounds. No wheezing.     Comments: Speaking in full sentences.  Clear lung sounds in all  fields. Abdominal:     General: There is no distension.     Palpations: Abdomen is soft. There is no mass.     Tenderness: There is no abdominal tenderness. There is no guarding or rebound.  Musculoskeletal:        General: Normal range of motion.     Cervical back: Normal range of motion and neck supple.  Skin:    General: Skin is warm and dry.     Capillary Refill: Capillary refill takes less than 2 seconds.  Neurological:     Mental Status: She is alert and oriented to person, place, and time.  Psychiatric:        Mood and Affect: Mood and affect normal.        Speech: Speech normal.        Behavior: Behavior normal.    ED Results / Procedures / Treatments   Labs (all labs ordered are listed, but only abnormal results are displayed) Labs Reviewed - No data to display  EKG None  Radiology No results found.  Procedures Procedures   Medications Ordered in ED Medications - No data to display  ED Course  I have reviewed the triage vital signs and the nursing notes.  Pertinent labs & imaging results that were available during my care of the patient were reviewed by me and considered in my medical decision making (see chart for details).    MDM Rules/Calculators/A&P                           Patient presenting for evaluation nasal congestion.  On exam, patient peers nontoxic.  Sinus congestion is worsening, most consistent with sinusitis.  Likely viral as patient is afebrile and with clear rhinorrhea.  Will treat with nasal sprays.  Discussed humidified air and importance of hydration.  Tylenol and ibuprofen for pain.  At this time, patient appears safe for discharge.  Return precautions in.  Patient states she understands and agrees to plan  Final Clinical Impression(s) / ED Diagnoses Final diagnoses:  Nasal congestion  Acute sinusitis, recurrence not specified, unspecified location    Rx / DC Orders ED Discharge Orders          Ordered    oxymetazoline (AFRIN  NASAL SPRAY) 0.05 % nasal spray  2 times daily        02/22/21 2100  fluticasone (FLONASE) 50 MCG/ACT nasal spray  Daily        02/22/21 2100             Alveria Apley, PA-C 02/22/21 2220    Ernie Avena, MD 02/22/21 2342

## 2021-02-22 NOTE — ED Triage Notes (Signed)
Pt reports cough and congestion lasting a week

## 2021-02-22 NOTE — Discharge Instructions (Addendum)
Use Afrin for the next 2 days.  Do not use longer than 3 days, as your nose can become addicted to it. Use Flonase daily to help with nasal congestion. Take ibuprofen 3 times a day for pain and inflammation. Use humidified air to help break up congestion. Follow-up with your primary care doctor as needed for recheck. Return to emergency room for any new commotion, concerning symptoms.

## 2021-03-02 ENCOUNTER — Other Ambulatory Visit: Payer: Self-pay

## 2021-03-02 ENCOUNTER — Ambulatory Visit: Payer: Medicaid Other | Admitting: Physician Assistant

## 2021-03-02 DIAGNOSIS — Z113 Encounter for screening for infections with a predominantly sexual mode of transmission: Secondary | ICD-10-CM | POA: Diagnosis not present

## 2021-03-02 LAB — WET PREP FOR TRICH, YEAST, CLUE
Trichomonas Exam: NEGATIVE
Yeast Exam: NEGATIVE

## 2021-03-02 NOTE — Progress Notes (Signed)
Pt here for STD screening.  Wet mount results reviewed, no treatment required.  Xzavian Semmel M Frayda Egley, RN ? ?

## 2021-03-03 ENCOUNTER — Encounter: Payer: Self-pay | Admitting: Physician Assistant

## 2021-03-03 NOTE — Progress Notes (Signed)
[facility] Department STI clinic/screening visit  Subjective:  Felicia Castillo is a 30 y.o. female being seen today for an STI screening visit. The patient reports they do not have symptoms.  Patient reports that they do not desire a pregnancy in the next year.   They reported they are not interested in discussing contraception today.  Patient's last menstrual period was 02/07/2021 (exact date).   Patient has the following medical conditions:   Patient Active Problem List   Diagnosis Date Noted   Moderate dysplasia of cervix 10/30/2013   BV (bacterial vaginosis) 10/10/2013   LGSIL (low grade squamous intraepithelial dysplasia) 10/04/2013   Abnormal uterine bleeding 06/07/2013   Unprotected sexual intercourse 06/07/2013   Screening examination for venereal disease 06/07/2013   Papanicolaou smear of cervix with low grade squamous intraepithelial lesion (LGSIL) 04/04/2013   LSIL (low grade squamous intraepithelial lesion) on Pap smear 03/01/2013   Well woman exam 02/23/2013   Hemorrhoids 10/12/2012    Chief Complaint  Patient presents with   SEXUALLY TRANSMITTED DISEASE    screening    HPI  Patient reports that she is not having any symptoms but would like a screening today.  Reports that she was treated for Trich a few months ago and wants to make sure that it has cleared.  States that she had her annual PE with pap last week with her PCP but they forgot to order testing for Trich, so she came here.  Reports history of asthma and environmental allergies but does not take medicines regularly, only when needed.  States that her PCP ordered OCP for her and she is to start them with her next period.  Last HIV and pap done last week.  See flowsheet for further details and programmatic requirements.    The following portions of the patient's history were reviewed and updated as appropriate: allergies, current medications, past medical history, past social history, past  surgical history and problem list.  Objective:  There were no vitals filed for this visit.  Physical Exam Constitutional:      General: She is not in acute distress.    Appearance: Normal appearance.  HENT:     Head: Normocephalic and atraumatic.     Comments: No nits,lice, or hair loss. No cervical, supraclavicular or axillary adenopathy.  Eyes:     Conjunctiva/sclera: Conjunctivae normal.  Pulmonary:     Effort: Pulmonary effort is normal.  Musculoskeletal:     Cervical back: Neck supple. No tenderness.  Skin:    General: Skin is warm and dry.  Neurological:     Mental Status: She is alert and oriented to person, place, and time.  Psychiatric:        Mood and Affect: Mood normal.        Behavior: Behavior normal.        Thought Content: Thought content normal.        Judgment: Judgment normal.     Assessment and Plan:  Felicia Castillo is a 30 y.o. female presenting to the [facility] Department for STI screening  1. Screening for STD (sexually transmitted disease) Patient into clinic without symptoms. Enc patient to start OCP as directed by PCP. Rec condoms with all sex. Await test results.  Counseled that RN will call if needs to RTC for treatment once results are back.  - WET PREP FOR TRICH, YEAST, CLUE - Chlamydia/Gonorrhea [facility]     No follow-ups on file.  No future appointments.  Jerene Dilling, PA

## 2021-04-27 ENCOUNTER — Ambulatory Visit: Payer: Medicaid Other

## 2021-05-07 ENCOUNTER — Ambulatory Visit: Payer: Medicaid Other

## 2021-05-21 ENCOUNTER — Ambulatory Visit: Payer: Self-pay

## 2021-06-24 ENCOUNTER — Ambulatory Visit: Payer: Medicaid Other

## 2021-09-15 IMAGING — CT CT HEAD W/O CM
3 series · 16 of 47 positions shown, 19 images · non-contrast
Comparison: Maxillofacial CT 12/09/2017.

CLINICAL DATA: Facial trauma. Additional provided: Syncopal episode
at work with head trauma, intermittent dizziness, laceration to
right forehead.

EXAM:
CT HEAD WITHOUT CONTRAST
TECHNIQUE: Contiguous axial images were obtained from the base of the skull
through the vertex without intravenous contrast.

[Series 2: head wo · axial · 0.40mm/px · z∈[-173,-33]mm · 10 of 34 slices shown, 13 images]
[im 3/34  brain]
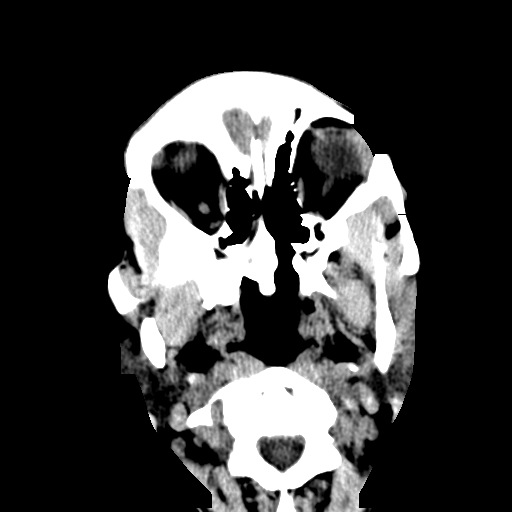
[im 3/34  bone]
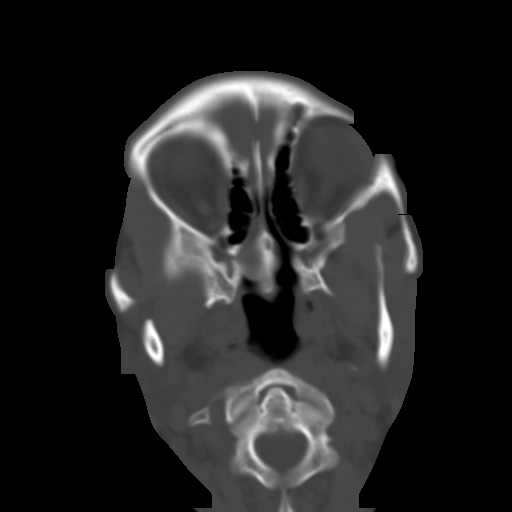
[im 6/34  brain]
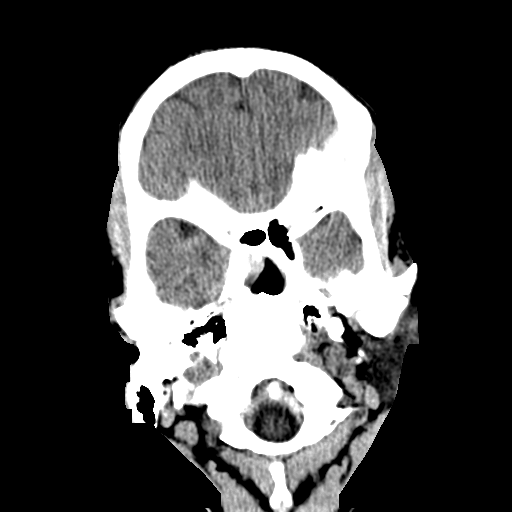
[im 10/34  brain]
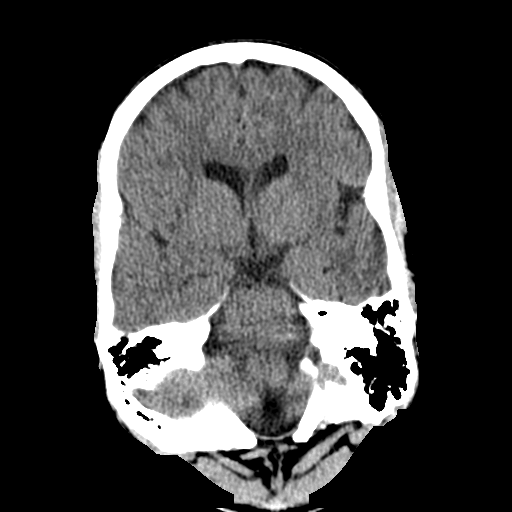
[im 12/34  brain]
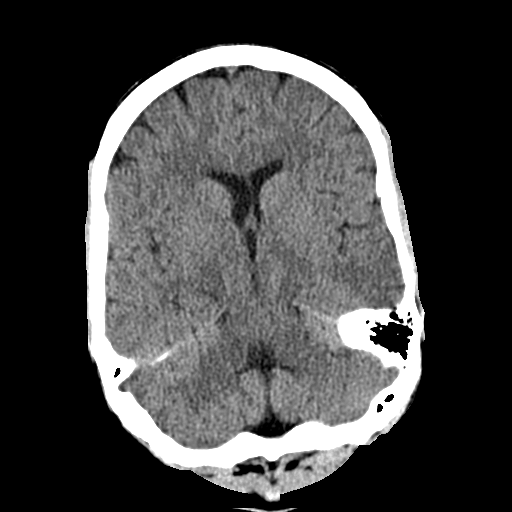
[im 15/34  brain]
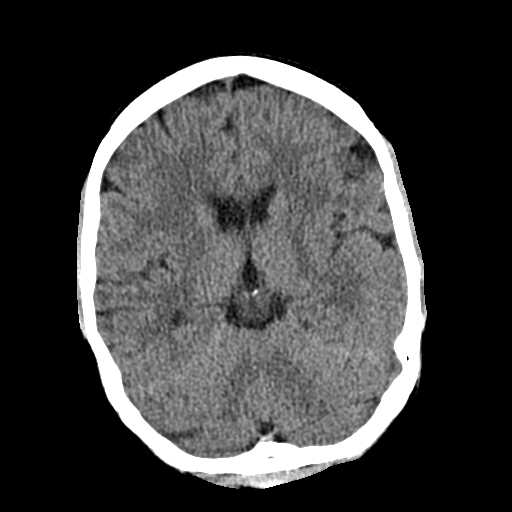
[im 15/34  bone]
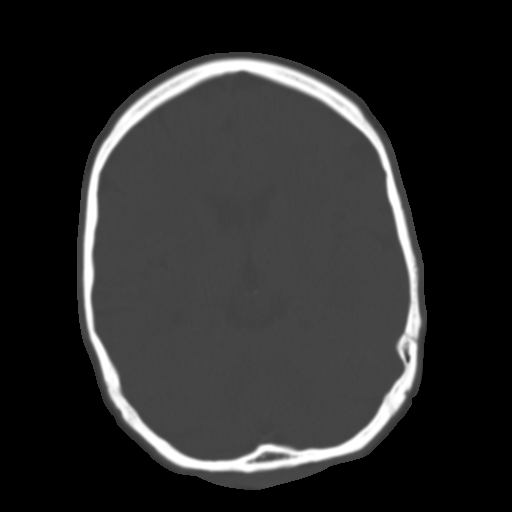
[im 19/34  brain]
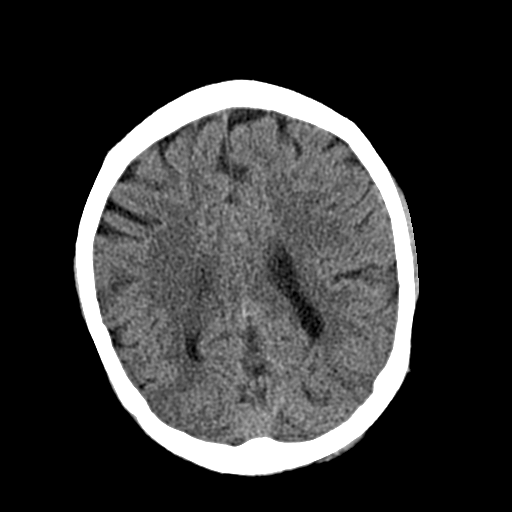
[im 22/34  brain]
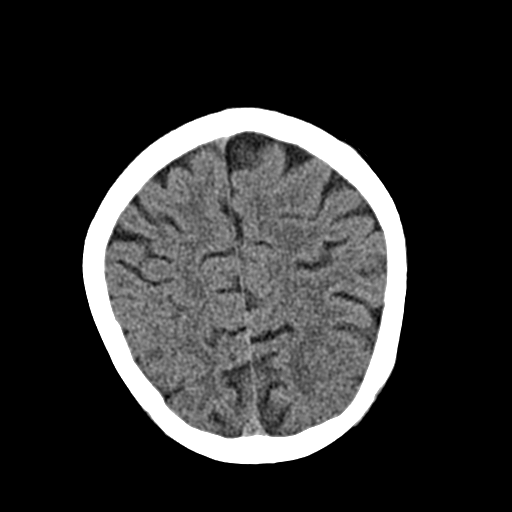
[im 26/34  brain]
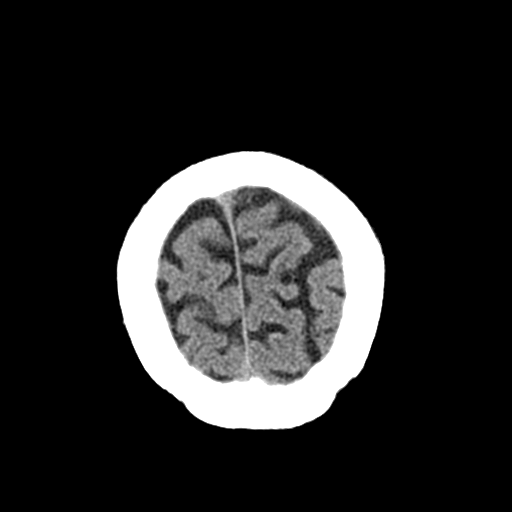
[im 28/34  brain]
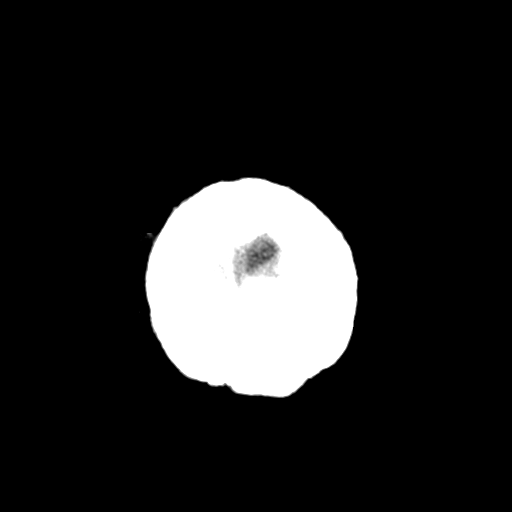
[im 28/34  bone]
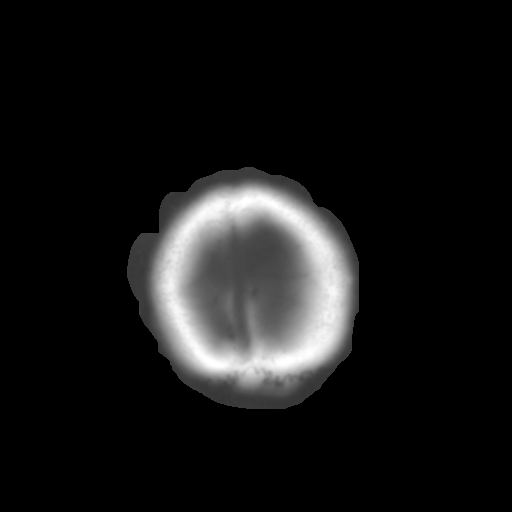
[im 31/34  brain]
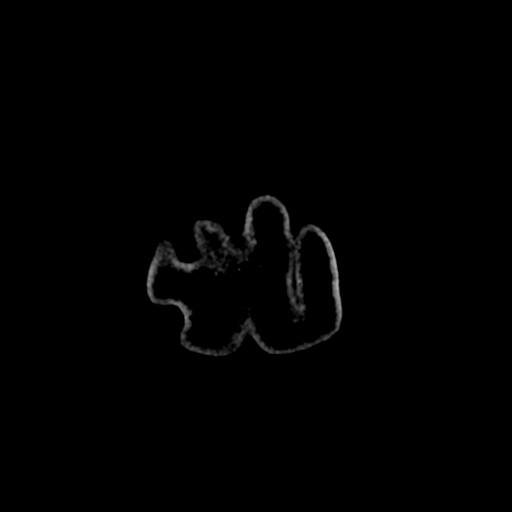

[Series 4: cor soft · coronal · 0.33mm/px · 3 of 66 slices shown]
[im 22/66  brain]
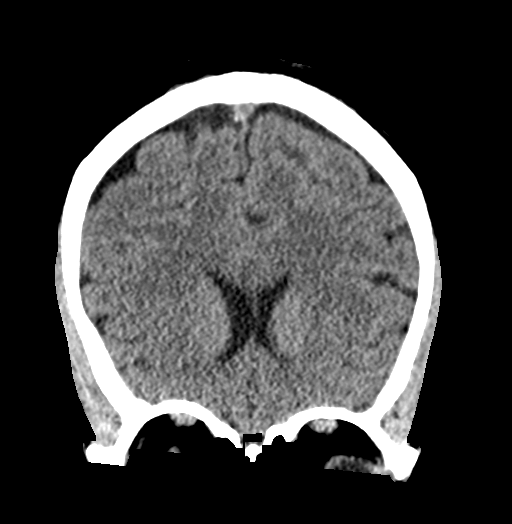
[im 29/66  brain]
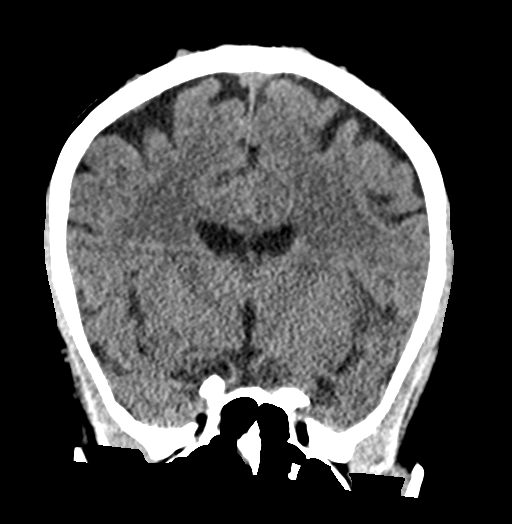
[im 37/66  brain]
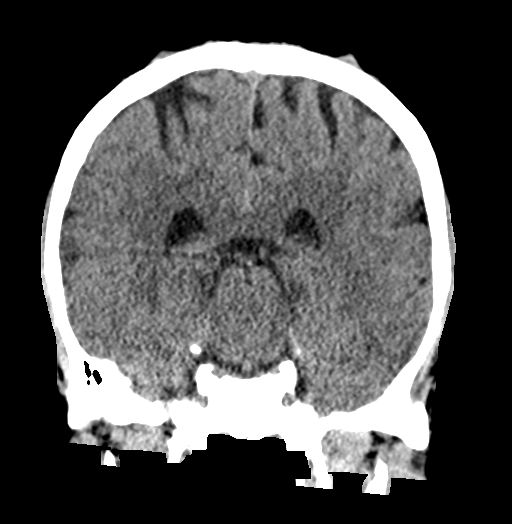

[Series 5: sag soft · sagittal · 0.33mm/px · 3 of 53 slices shown]
[im 18/53  brain]
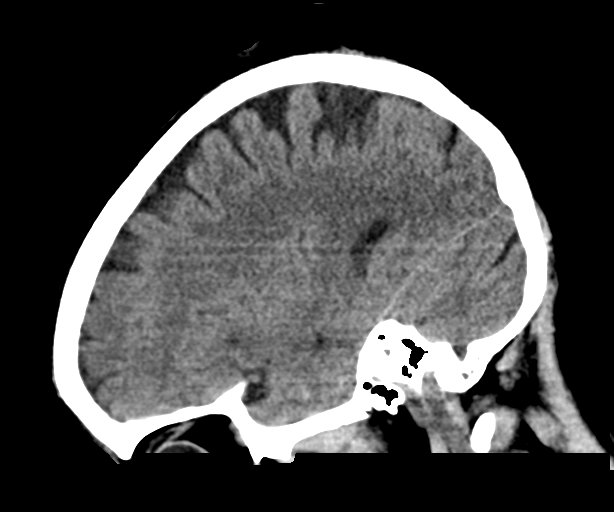
[im 27/53  brain]
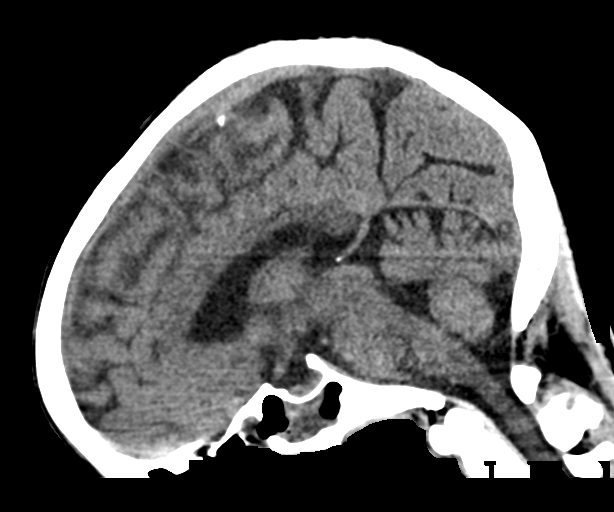
[im 35/53  brain]
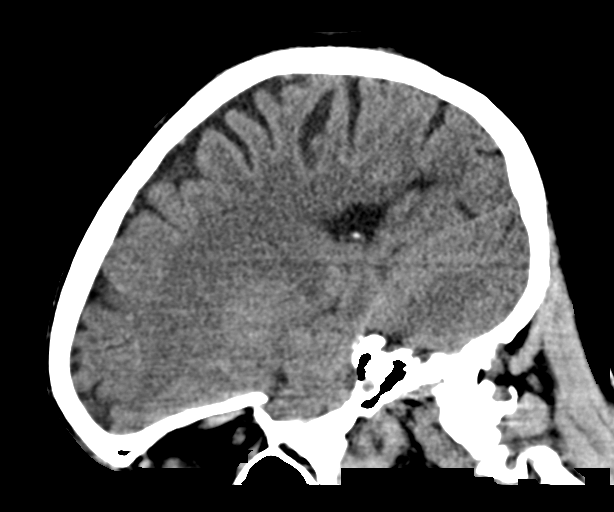

[16 of 47 positions shown; findings below may reference images not displayed]

FINDINGS: Brain:

There is no acute intracranial hemorrhage.

No demarcated cortical infarct.

No extra-axial fluid collection.

No evidence of intracranial mass.

No midline shift.

Vascular: No hyperdense vessel.

Skull: Normal. Negative for fracture or focal lesion.

Sinuses/Orbits: Visualized orbits show no acute finding. Extensive
partial opacification of the right sphenoid sinus. No significant
mastoid effusion.
IMPRESSION: No evidence of acute intracranial abnormality.

Right sphenoid sinusitis.

## 2021-11-19 ENCOUNTER — Ambulatory Visit: Payer: Medicaid Other

## 2022-01-07 ENCOUNTER — Ambulatory Visit: Payer: Medicaid Other | Admitting: Advanced Practice Midwife

## 2022-01-07 ENCOUNTER — Encounter: Payer: Self-pay | Admitting: Advanced Practice Midwife

## 2022-01-07 DIAGNOSIS — B379 Candidiasis, unspecified: Secondary | ICD-10-CM

## 2022-01-07 DIAGNOSIS — Z113 Encounter for screening for infections with a predominantly sexual mode of transmission: Secondary | ICD-10-CM | POA: Diagnosis not present

## 2022-01-07 LAB — HM HIV SCREENING LAB: HM HIV Screening: NEGATIVE

## 2022-01-07 LAB — WET PREP FOR TRICH, YEAST, CLUE: Trichomonas Exam: NEGATIVE

## 2022-01-07 MED ORDER — CLOTRIMAZOLE 1 % VA CREA: 1.0000 | TOPICAL_CREAM | Freq: Every day | VAGINAL | 0 refills | Status: AC

## 2022-01-07 NOTE — Addendum Note (Signed)
Addended by: Berdie Ogren on: 01/07/2022 04:47 PM   Modules accepted: Orders

## 2022-01-07 NOTE — Progress Notes (Signed)
[facility] Department  STI clinic/screening visit [address] [place] [address] [phone]  Subjective:  Felicia Castillo is a 31 y.o. SBF G1P1 nonsmoker female being seen today for an STI screening visit. The patient reports they do not have symptoms.  Patient reports that they do not desire a pregnancy in the next year.   They reported they are not interested in discussing contraception today.    Patient's last menstrual period was 12/16/2021 (exact date).   Patient has the following medical conditions:   Patient Active Problem List   Diagnosis Date Noted   Moderate dysplasia of cervix 10/30/2013   LGSIL (low grade squamous intraepithelial dysplasia) 10/04/2013   Abnormal uterine bleeding 06/07/2013   Papanicolaou smear of cervix with low grade squamous intraepithelial lesion (LGSIL) 04/04/2013   LSIL (low grade squamous intraepithelial lesion) on Pap smear 03/01/2013   Hemorrhoids 10/12/2012    Chief Complaint  Patient presents with   SEXUALLY TRANSMITTED DISEASE    Screening- patient had recently tested positive for chlamydia on July 13th  and was treated also had bv and yeast she would like to be re tested to make sure everything cleared up.  Patient was tested and treated at [facility] health department     HPI  Patient reports asymptomatic. LMP 12/16/21. Last sex 01/06/22 without condom; with current partner x 6 mo; 1 sex partner in last 3 mo.   Last HIV test per patient/review of record was 08/20/19 Patient reports last pap was 01/11/17 neg   Screening for MPX risk: Does the patient have an unexplained rash? No Is the patient MSM? No Does the patient endorse multiple sex partners or anonymous sex partners? No Did the patient have close or sexual contact with a person diagnosed with MPX? No Has the patient traveled outside the [place] where MPX is endemic? No Is there a high clinical suspicion for MPX-- evidenced by one of the following  No  -Unlikely to be chickenpox  -Lymphadenopathy  -Rash that present in same phase of evolution on any given body part See flowsheet for further details and programmatic requirements.   Immunization history:  Immunization History  Administered Date(s) Administered   Hepatitis A 08/01/2006   Hepatitis B 07/13/2000, 08/10/2000, 03/01/2002, 04/10/2002, 08/21/2002, 09/13/2003   Hpv-Unspecified 08/01/2006   Influenza,inj,Quad PF,6+ Mos 04/23/2016   Tdap 04/23/2016     The following portions of the patient's history were reviewed and updated as appropriate: allergies, current medications, past medical history, past social history, past surgical history and problem list.  Objective:  There were no vitals filed for this visit.  Physical Exam Vitals and nursing note reviewed.  Constitutional:      Appearance: Normal appearance. She is normal weight.  HENT:     Head: Normocephalic and atraumatic.     Mouth/Throat:     Mouth: Mucous membranes are moist.     Pharynx: Oropharynx is clear. No oropharyngeal exudate or posterior oropharyngeal erythema.  Eyes:     Conjunctiva/sclera: Conjunctivae normal.  Pulmonary:     Effort: Pulmonary effort is normal.  Abdominal:     General: Abdomen is flat.     Palpations: Abdomen is soft. There is no mass.     Tenderness: There is no abdominal tenderness. There is no rebound.     Comments: Soft without masses or tenderness, good tone  Genitourinary:    General: Normal vulva.     Exam position: Lithotomy position.     Pubic Area:  No rash or pubic lice.      Labia:        Right: No rash or lesion.        Left: No rash or lesion.      Vagina: Vaginal discharge (white creamy leukorrhea, ph<4.5) present. No erythema, bleeding or lesions.     Cervix: Normal.     Uterus: Normal.      Adnexa: Right adnexa normal and left adnexa normal.     Rectum: Normal.     Comments: pH = <4.5 Lymphadenopathy:     Head:     Right side of head: No preauricular or  posterior auricular adenopathy.     Left side of head: No preauricular or posterior auricular adenopathy.     Cervical: No cervical adenopathy.     Right cervical: No superficial, deep or posterior cervical adenopathy.    Left cervical: No superficial, deep or posterior cervical adenopathy.     Upper Body:     Right upper body: No supraclavicular, axillary or epitrochlear adenopathy.     Left upper body: No supraclavicular, axillary or epitrochlear adenopathy.     Lower Body: No right inguinal adenopathy. No left inguinal adenopathy.  Skin:    General: Skin is warm and dry.     Findings: No rash.  Neurological:     Mental Status: She is alert and oriented to person, place, and time.     Assessment and Plan:  Felicia Castillo is a 31 y.o. female presenting to the [facility] Department for STI screening  1. Screening examination for venereal disease Treat wet mount per standing orders Immunization nurse consult  - WET PREP FOR TRICH, YEAST, CLUE - Syphilis Serology, [facility] - Chlamydia/Gonorrhea [facility] - HIV [place] - Gonococcus culture     Return if symptoms worsen or fail to improve.  No future appointments.  Alberteen Spindle, CNM

## 2022-01-07 NOTE — Progress Notes (Signed)
Pt here for STD screening.  Wet mount results reviewed.  The patient was dispensed Clotrimazole 1% vaginal cream today. I provided counseling today regarding the medication. We discussed the medication, the side effects and when to call clinic. Patient given the opportunity to ask questions. Questions answered.  Condoms given.  Berdie Ogren, RN

## 2022-01-12 LAB — GONOCOCCUS CULTURE

## 2022-04-23 ENCOUNTER — Ambulatory Visit: Payer: Medicaid Other

## 2022-05-25 ENCOUNTER — Ambulatory Visit: Payer: Medicaid Other | Admitting: Family Medicine

## 2022-05-25 DIAGNOSIS — N760 Acute vaginitis: Secondary | ICD-10-CM

## 2022-05-25 DIAGNOSIS — Z113 Encounter for screening for infections with a predominantly sexual mode of transmission: Secondary | ICD-10-CM

## 2022-05-25 LAB — WET PREP FOR TRICH, YEAST, CLUE
Trichomonas Exam: NEGATIVE
Yeast Exam: NEGATIVE

## 2022-05-25 LAB — HM HIV SCREENING LAB: HM HIV Screening: NEGATIVE

## 2022-05-25 MED ORDER — METRONIDAZOLE 500 MG PO TABS: 500.0000 mg | ORAL_TABLET | Freq: Two times a day (BID) | ORAL | 0 refills | Status: AC

## 2022-05-25 NOTE — Progress Notes (Signed)
Pt. Seen for STI screening and syphilis treatment. Seen by FNP Lowella Petties. Pt. Treated for BV per standing order. Dispensed to pt. Metronidazole education given and questions answered. Pt. Aware she will receive a call for any abnormal test results.

## 2022-05-25 NOTE — Progress Notes (Signed)
Dx:

## 2022-05-25 NOTE — Addendum Note (Signed)
Addended by: Sharlet Salina on: 05/25/2022 11:12 AM   Modules accepted: Orders

## 2022-05-25 NOTE — Progress Notes (Addendum)
[facility] Department  STI clinic/screening visit [place] [address] [phone]  Subjective:  Felicia Castillo is a 32 y.o. female being seen today for an STI screening visit. The patient reports they do have symptoms.  Patient reports that they do not desire a pregnancy in the next year.   They reported they are not interested in discussing contraception today.    Patient's last menstrual period was 05/11/2022 (exact date).   Patient has the following medical conditions:   Patient Active Problem List   Diagnosis Date Noted   Moderate dysplasia of cervix 10/30/2013   Abnormal uterine bleeding 06/07/2013   LSIL (low grade squamous intraepithelial lesion) on Pap smear 03/01/2013   Hemorrhoids 10/12/2012    Chief Complaint  Patient presents with   SEXUALLY TRANSMITTED DISEASE    Screening- patient complaining of vaginal discharge and odor     HPI  Patient reports to clinic with 3-4 day hx of discharge and odor.   Does the patient using douching products? No  Last HIV test per patient/review of record was  Lab Results  Component Value Date   HMHIVSCREEN Negative - Validated 01/07/2022    Lab Results  Component Value Date   HIV NON REACTIVE 08/20/2019   Patient reports last pap was  Lab Results  Component Value Date   DIAGPAP  01/11/2017    NEGATIVE FOR INTRAEPITHELIAL LESIONS OR MALIGNANCY. BENIGN REACTIVE/REPARATIVE CHANGES.   DIAGPAP SHIFT IN [place] SUGGESTIVE OF BACTERIAL VAGINOSIS. 01/11/2017      Screening for MPX risk: Does the patient have an unexplained rash? No Is the patient MSM? No Does the patient endorse multiple sex partners or anonymous sex partners? No Did the patient have close or sexual contact with a person diagnosed with MPX? No Has the patient traveled outside the [place] where MPX is endemic? No Is there a high clinical suspicion for MPX-- evidenced by one of the following No  -Unlikely to be  chickenpox  -Lymphadenopathy  -Rash that present in same phase of evolution on any given body part See flowsheet for further details and programmatic requirements.   Immunization history:  Immunization History  Administered Date(s) Administered   Hepatitis A 08/01/2006   Hepatitis B 07/13/2000, 08/10/2000, 03/01/2002, 04/10/2002, 08/21/2002, 09/13/2003   Hpv-Unspecified 08/01/2006   Influenza,inj,Quad PF,6+ Mos 04/23/2016   Tdap 04/23/2016     The following portions of the patient's history were reviewed and updated as appropriate: allergies, current medications, past medical history, past social history, past surgical history and problem list.  Objective:  There were no vitals filed for this visit.  Physical Exam Vitals and nursing note reviewed.  Constitutional:      Appearance: Normal appearance.  HENT:     Head: Normocephalic and atraumatic.     Mouth/Throat:     Mouth: Mucous membranes are moist.     Pharynx: Oropharynx is clear. No oropharyngeal exudate or posterior oropharyngeal erythema.  Pulmonary:     Effort: Pulmonary effort is normal.  Abdominal:     General: Abdomen is flat.     Palpations: There is no mass.     Tenderness: There is no abdominal tenderness. There is no rebound.  Genitourinary:    General: Normal vulva.     Exam position: Lithotomy position.     Pubic Area: No rash or pubic lice.      Tanner stage (genital): 5.     Labia:        Right: No  rash or lesion.        Left: No rash or lesion.      Vagina: Vaginal discharge present. No erythema, bleeding or lesions.     Cervix: No cervical motion tenderness, discharge, friability, lesion or erythema.     Uterus: Normal.      Adnexa: Right adnexa normal and left adnexa normal.     Rectum: Normal.     Comments: pH = 4-5  Small amount of white discharge visualized Lymphadenopathy:     Head:     Right side of head: No preauricular or posterior auricular adenopathy.     Left side of head: No  preauricular or posterior auricular adenopathy.     Cervical: No cervical adenopathy.     Upper Body:     Right upper body: No supraclavicular, axillary or epitrochlear adenopathy.     Left upper body: No supraclavicular, axillary or epitrochlear adenopathy.     Lower Body: No right inguinal adenopathy. No left inguinal adenopathy.  Skin:    General: Skin is warm and dry.     Findings: No rash.  Neurological:     Mental Status: She is alert and oriented to person, place, and time.      Assessment and Plan:  Felicia Castillo is a 32 y.o. female presenting to the [facility] Department for STI screening  1. Screening for venereal disease Patient states she has had 3-4 days of discharge and odor. Has had BV before. Wonders if this is BV. Patient endorses using a feminine wash- Burnell Blanks. Advised to stop using this product- discussed how this can alter vaginal flora.  - HIV [facility] LAB - Chlamydia/Gonorrhea [place] [facility] - Syphilis Serology,  Lab - WET PREP FOR [place], YEAST, [facility]  2. Bacterial vaginosis  - metroNIDAZOLE (FLAGYL) 500 MG tablet; Take 1 tablet (500 mg total) by mouth 2 (two) times daily for 7 days.  Dispense: 14 tablet; Refill: 0  Patient accepted all screenings including oral, vaginal CT/GC and bloodwork for HIV/RPR, and wet prep. Patient meets criteria for HepB screening? No. Ordered? No - not indicated Patient meets criteria for HepC screening? No. Ordered? No - not indicated  Treat wet prep per standing order Discussed time line for State Lab results and that patient will be called with positive results and encouraged patient to call if she had not heard in 2 weeks.  Counseled to return or seek care for continued or worsening symptoms Recommended condom use with all sex  Patient is currently using  female condom  to prevent pregnancy.    Return if symptoms worsen or fail to improve.  Total time spent 20  minutes.   Sharlet Salina, [place]

## 2022-05-28 ENCOUNTER — Telehealth: Payer: Self-pay | Admitting: Family Medicine

## 2022-05-28 ENCOUNTER — Other Ambulatory Visit: Payer: Self-pay | Admitting: Nurse Practitioner

## 2022-05-28 DIAGNOSIS — N760 Acute vaginitis: Secondary | ICD-10-CM

## 2022-05-28 DIAGNOSIS — B9689 Other specified bacterial agents as the cause of diseases classified elsewhere: Secondary | ICD-10-CM

## 2022-05-28 MED ORDER — METRONIDAZOLE 1 % EX GEL: Freq: Every day | CUTANEOUS | 0 refills | Status: DC

## 2022-05-28 NOTE — Progress Notes (Signed)
Telephone call from patient stating that she is have a difficult time taking the Metronidazole that was prescribed to her on 05/25/22.    Patient desires an alternative method of treatment.  Prescribed Metrogel, prescription submitted to pharmacy. Gregary Cromer, FNP   1. Bacterial vaginosis  - metroNIDAZOLE (METROGEL) 1 % gel; Apply topically daily.  Dispense: 45 g; Refill: 0

## 2022-05-28 NOTE — Telephone Encounter (Signed)
Pt says she is having problems swallowing the medication that she was prescribed and that she vomited after trying to take it. She would like to be switched to a different medication. Please call her back asap. Thanks

## 2022-06-15 ENCOUNTER — Ambulatory Visit: Payer: Medicaid Other

## 2022-07-05 ENCOUNTER — Ambulatory Visit: Payer: Medicaid Other

## 2022-07-22 ENCOUNTER — Ambulatory Visit: Payer: Medicaid Other | Admitting: Obstetrics

## 2022-07-27 ENCOUNTER — Telehealth: Payer: Self-pay | Admitting: Family Medicine

## 2022-07-27 ENCOUNTER — Ambulatory Visit: Payer: Medicaid Other

## 2022-07-27 NOTE — Telephone Encounter (Signed)
Called pt to [facility] for today; pt states she was not able to swallow/tolerate Metro pill she was given for BV & thinks she may still have some lingering BV; requests a cream for BV & asks if she could pick up up here instead of calling into pharmacy. Please advise.

## 2022-08-10 ENCOUNTER — Ambulatory Visit: Payer: Medicaid Other | Admitting: Family Medicine

## 2022-09-07 ENCOUNTER — Ambulatory Visit: Payer: Medicaid Other

## 2023-01-14 ENCOUNTER — Emergency Department
Admission: EM | Admit: 2023-01-14 | Discharge: 2023-01-14 | Disposition: A | Discharge status: Home / Self Care | Payer: MEDICAID | Attending: Emergency Medicine | Admitting: Emergency Medicine

## 2023-01-14 ENCOUNTER — Other Ambulatory Visit: Payer: Self-pay

## 2023-01-14 DIAGNOSIS — L234 Allergic contact dermatitis due to dyes: Secondary | ICD-10-CM | POA: Insufficient documentation

## 2023-01-14 DIAGNOSIS — L818 Other specified disorders of pigmentation: Secondary | ICD-10-CM | POA: Diagnosis present

## 2023-01-14 DIAGNOSIS — L252 Unspecified contact dermatitis due to dyes: Secondary | ICD-10-CM

## 2023-01-14 NOTE — ED Provider Notes (Signed)
[facility] Emergency Department Provider Note     Event Date/Time   First MD Initiated Contact with Patient 01/14/23 1745     (approximate)   History   Ink stains   HPI  Felicia Castillo is a 32 y.o. female presents to the ED for concern for skin irritation after some entering that was in some boxes she was moving accidentally got onto her chin and fingers.  Patient did not describe the areas with soap, water using a paper towel, ambulation irritated her skin versus a possible allergic reaction.  She presents to the ED for evaluation.  She denies any fevers, chills, sweats.  She still notes 2-3 small areas to her chin where the ink got on her.  She denies any hives, difficulty breathing, swallowing, or oral swelling.  Physical Exam   Triage Vital Signs: ED Triage Vitals  Encounter Vitals Group     BP 01/14/23 1708 105/76     Systolic BP Percentile --      Diastolic BP Percentile --      Pulse Rate 01/14/23 1708 61     Resp 01/14/23 1708 16     Temp 01/14/23 1708 98.9 F (37.2 C)     Temp Source 01/14/23 1708 Oral     SpO2 01/14/23 1708 100 %     Weight 01/14/23 1708 133 lb (60.3 kg)     Height 01/14/23 1708 5\' 7"  (1.702 m)     Head Circumference --      Peak Flow --      Pain Score 01/14/23 1716 0     Pain Loc --      Pain Education --      Exclude from Growth Chart --     Most recent vital signs: Vitals:   01/14/23 1708  BP: 105/76  Pulse: 61  Resp: 16  Temp: 98.9 F (37.2 C)  SpO2: 100%    General Awake, no distress. NAD HEENT NCAT. PERRL. EOMI. No rhinorrhea. Mucous membranes are moist.  CV:  Good peripheral perfusion.  RESP:  Normal effort.  ABD:  No distention.  SKIN:  Digital length noted to the fingertips on several fingers of her left hand.  Additionally, 3 distinct areas of ink on her chin, all wiped in the way easily with alcohol swab.   ED Results / Procedures / Treatments   Labs (all labs ordered are listed,  but only abnormal results are displayed) Labs Reviewed - No data to display   EKG   RADIOLOGY   No results found.   PROCEDURES:  Critical Care performed: No  Procedures   MEDICATIONS ORDERED IN ED: Medications - No data to display   IMPRESSION / MDM / ASSESSMENT AND PLAN / ED COURSE  I reviewed the triage vital signs and the nursing notes.                              Differential diagnosis includes, but is not limited to, contact dermatitis, irritant dermatitis, chemical burn  Patient's presentation is most consistent with acute, uncomplicated illness.  Patient's diagnosis is consistent with contact with ankle without evidence of acute local allergic reaction.  Patient likely causing local irritation to the skin with her attempts to scrub the area using a paper towel with soap and water.  No evidence of any local erythema, duration, warmth, hives, or whelps.  No mucous membrane involvement noted.  Patient  advised to use vinegar, alcohol, or nail polish remover to help remove any residual ink.  Patient is to follow up with primary provider as needed or otherwise directed. Patient is given ED precautions to return to the ED for any worsening or new symptoms.  FINAL CLINICAL IMPRESSION(S) / ED DIAGNOSES   Final diagnoses:  Contact dermatitis due to dye, unspecified contact dermatitis type     Rx / DC Orders   ED Discharge Orders     None        Note:  This document was prepared using Dragon voice recognition software and may include unintentional dictation errors.    Lissa Hoard, PA-C 01/14/23 1809    Minna Antis, MD 01/14/23 [phone]

## 2023-01-14 NOTE — ED Triage Notes (Addendum)
Pt picked up boxes of ink at work which got on chin and fingers. Pt scrubbed the ink off but wants to evaluated for possible allergic reaction or if she needs a solution to remove it. Pt scrubbed and had a rash afterwards. Pt denies itching or burning, small red spots of staining noted, no redness or hives noted. Pt denies need for worker's comp.

## 2023-01-14 NOTE — Discharge Instructions (Addendum)
Your exam is consistent with heat exposure to the skin.  No signs of an allergic reaction. You can use vinegar, alcohol, or nail polish remover to any residual ink. Moisterize the skin as necessary. See your provider for ongoing care.

## 2023-03-14 ENCOUNTER — Ambulatory Visit: Payer: MEDICAID

## 2023-04-14 ENCOUNTER — Ambulatory Visit: Payer: MEDICAID

## 2023-05-03 ENCOUNTER — Emergency Department
Admission: EM | Admit: 2023-05-03 | Discharge: 2023-05-04 | Disposition: A | Discharge status: Home / Self Care | Payer: MEDICAID | Attending: Emergency Medicine | Admitting: Emergency Medicine

## 2023-05-03 ENCOUNTER — Encounter: Payer: Self-pay | Admitting: Emergency Medicine

## 2023-05-03 ENCOUNTER — Other Ambulatory Visit: Payer: Self-pay

## 2023-05-03 ENCOUNTER — Emergency Department: Payer: MEDICAID

## 2023-05-03 DIAGNOSIS — O0091 Unspecified ectopic pregnancy with intrauterine pregnancy: Secondary | ICD-10-CM

## 2023-05-03 DIAGNOSIS — Z3A01 Less than 8 weeks gestation of pregnancy: Secondary | ICD-10-CM

## 2023-05-03 DIAGNOSIS — O00202 Left ovarian pregnancy without intrauterine pregnancy: Secondary | ICD-10-CM

## 2023-05-03 DIAGNOSIS — O00201 Right ovarian pregnancy without intrauterine pregnancy: Secondary | ICD-10-CM | POA: Insufficient documentation

## 2023-05-03 DIAGNOSIS — O00101 Right tubal pregnancy without intrauterine pregnancy: Secondary | ICD-10-CM | POA: Insufficient documentation

## 2023-05-03 DIAGNOSIS — O26899 Other specified pregnancy related conditions, unspecified trimester: Secondary | ICD-10-CM | POA: Insufficient documentation

## 2023-05-03 DIAGNOSIS — Z3A00 Weeks of gestation of pregnancy not specified: Secondary | ICD-10-CM | POA: Insufficient documentation

## 2023-05-03 DIAGNOSIS — O26859 Spotting complicating pregnancy, unspecified trimester: Secondary | ICD-10-CM | POA: Diagnosis not present

## 2023-05-03 LAB — COMPREHENSIVE METABOLIC PANEL
ALT: 44 U/L (ref 0–44)
AST: 25 U/L (ref 15–41)
Albumin: 3.8 g/dL (ref 3.5–5.0)
Alkaline Phosphatase: 95 U/L (ref 38–126)
Anion gap: 6 (ref 5–15)
BUN: 14 mg/dL (ref 6–20)
CO2: 25 mmol/L (ref 22–32)
Calcium: 9.1 mg/dL (ref 8.9–10.3)
Chloride: 104 mmol/L (ref 98–111)
Creatinine, Ser: 0.55 mg/dL (ref 0.44–1.00)
GFR, Estimated: >60 mL/min (ref >60)
Glucose, Bld: 97 mg/dL (ref 70–99)
Potassium: 3.9 mmol/L (ref 3.5–5.1)
Sodium: 135 mmol/L (ref 135–145)
Total Bilirubin: 0.5 mg/dL (ref <1.2)
Total Protein: 7.8 g/dL (ref 6.5–8.1)

## 2023-05-03 LAB — WET PREP, GENITAL
Clue Cells Wet Prep HPF POC: NONE SEEN
Sperm: NONE SEEN
Trich, Wet Prep: NONE SEEN
WBC, Wet Prep HPF POC: <10 (ref <10)
Yeast Wet Prep HPF POC: NONE SEEN

## 2023-05-03 LAB — URINALYSIS, ROUTINE W REFLEX MICROSCOPIC
Bilirubin Urine: NEGATIVE
Glucose, UA: NEGATIVE mg/dL
Hgb urine dipstick: NEGATIVE
Ketones, ur: NEGATIVE mg/dL
Leukocytes,Ua: NEGATIVE
Nitrite: NEGATIVE
Protein, ur: NEGATIVE mg/dL
Specific Gravity, Urine: 1.029 (ref 1.005–1.030)
pH: 5 (ref 5.0–8.0)

## 2023-05-03 LAB — CBC
HCT: 31.5 % — ABNORMAL LOW (ref 36.0–46.0)
Hemoglobin: 10.1 g/dL — ABNORMAL LOW (ref 12.0–15.0)
MCH: 23.7 pg — ABNORMAL LOW (ref 26.0–34.0)
MCHC: 32.1 g/dL (ref 30.0–36.0)
MCV: 73.9 fL — ABNORMAL LOW (ref 80.0–100.0)
Platelets: 262 10*3/uL (ref 150–400)
RBC: 4.26 MIL/uL (ref 3.87–5.11)
RDW: 14.8 % (ref 11.5–15.5)
WBC: 4.9 10*3/uL (ref 4.0–10.5)
nRBC: 0 % (ref 0.0–0.2)

## 2023-05-03 LAB — LIPASE, BLOOD: Lipase: 27 U/L (ref 11–51)

## 2023-05-03 LAB — TYPE AND SCREEN
ABO/RH(D): O POS
Antibody Screen: NEGATIVE

## 2023-05-03 LAB — CHLAMYDIA/NGC RT PCR (ARMC ONLY)
Chlamydia Tr: NOT DETECTED
N gonorrhoeae: NOT DETECTED

## 2023-05-03 LAB — HCG, QUANTITATIVE, PREGNANCY: hCG, Beta Chain, Quant, S: 5816 m[IU]/mL — ABNORMAL HIGH (ref <5)

## 2023-05-03 MED ORDER — METHOTREXATE FOR ECTOPIC PREGNANCY
50.0000 mg/m2 | Freq: Once | INTRAMUSCULAR | Status: AC
  Administered 2023-05-04: 85 mg via INTRAMUSCULAR
  Filled 2023-05-03: qty 3.4

## 2023-05-03 NOTE — ED Triage Notes (Signed)
Patient to ED via POV for lower abd cramping and vaginal bleeding. States she finished her period on the 3rd of this month. States bright red blood- going through 2 pads a day.

## 2023-05-03 NOTE — Consult Note (Signed)
Consult History and Physical   SERVICE: OB/GYN  Patient Name: Felicia Castillo Patient MRN:   [id number]  CC: Abdominal pain and vaginal bleeding, ectopic pregnancy  HPI: KOA CRICKMORE is a 32 y.o. G1P1001 who presented to the ED after 3 days of abdominal pain/cramping and light vaginal bleeding. She was found to have an ectopic pregnancy. Her pain is currently mild and she is hemodynamically stable.    Review of Systems:  GEN:   Denies fevers, chills, weight changes, appetite changes, fatigue, night sweats CV:   Denies CP, palpitations PULM:  Denies SOB, cough GI:  Mild abd pain, denies N/V/D/C GU:  Denies dysuria, urgency, frequency  Past Obstetrical History: OB History     Gravida  1   Para  1   Term  1   Preterm      AB      Living  1      SAB      IAB      Ectopic      Multiple      Live Births  1           Past Gynecologic History: LMP 04/21/23 Menstrual frequency Q 28 days  Past Medical History: Past Medical History:  Diagnosis Date   Asthma    as a child   Bacterial vaginosis    Candidiasis of vulva and vagina 10/30/2013   Headache(784.0)    Herpes genitalis    Unspecified symptom associated with female genital organs 10/10/2013   Yeast infection 02/23/2013    Past Surgical History:   Past Surgical History:  Procedure Laterality Date   NO PAST SURGERIES      Family History:  family history includes Depression in her brother and mother; Diabetes in her father; Hypertension in her father; Kidney disease in her maternal grandmother and paternal grandmother.  Social History:  Social History   Socioeconomic History   Marital status: Single    Spouse name: Not on file   Number of children: 1   Years of education: Not on file   Highest education level: Not on file  Occupational History   Not on file  Tobacco Use   Smoking status: Never   Smokeless tobacco: Never  Vaping Use   Vaping status: Never Used  Substance and  Sexual Activity   Alcohol use: No   Drug use: No   Sexual activity: Yes    Partners: Male    Birth control/protection: None  Other Topics Concern   Not on file  Social History Narrative   Not on file   Social Determinants of Health   Financial Resource Strain: Not on file  Food Insecurity: Not on file  Transportation Needs: Not on file  Physical Activity: Not on file  Stress: Not on file  Social Connections: Not on file  Intimate Partner Violence: Not on file    Home Medications:  Medications reconciled in EPIC  No current facility-administered medications on file prior to encounter.   Current Outpatient Medications on File Prior to Encounter  Medication Sig Dispense Refill   metroNIDAZOLE (METROGEL) 1 % gel Apply topically daily. 45 g 0    Allergies:  Allergies  Allergen Reactions   Amoxicillin Rash    Physical Exam:  Temp:  [97.9 F (36.6 C)-98.4 F (36.9 C)] 98.3 F (36.8 C) (12/10 2037) Pulse Rate:  [64] 64 (12/10 2037) Resp:  [17-18] 17 (12/10 2037) BP: (112-124)/(66-76) 124/72 (12/10 2037) SpO2:  [98 %-100 %] 100 % (  12/10 2037) Weight:  [59.9 kg] 59.9 kg (12/10 1216)   General Appearance:  Well developed, well nourished, no acute distress, alert and oriented x3 Cardiovascular:  Normal S1/S2, regular rate and rhythm, no murmurs Pulmonary:  clear to auscultation, no wheezes, rales or rhonchi, symmetric air entry, good air exchange Abdomen:  Bowel sounds present, soft, nontender, nondistended, no abnormal masses, no epigastric pain   Labs/Studies:   CBC and Coags:  Lab Results  Component Value Date   WBC 4.9 05/03/2023   NEUTOPHILPCT 54 07/28/2015   EOSPCT 2 07/28/2015   BASOPCT 0 07/28/2015   LYMPHOPCT 37 07/28/2015   HGB 10.1 (L) 05/03/2023   HCT 31.5 (L) 05/03/2023   MCV 73.9 (L) 05/03/2023   PLT 262 05/03/2023   CMP:  Lab Results  Component Value Date   NA 135 05/03/2023   K 3.9 05/03/2023   CL 104 05/03/2023   CO2 25 05/03/2023    BUN 14 05/03/2023   CREATININE 0.55 05/03/2023   CREATININE 0.74 03/19/2020   CREATININE 0.73 07/28/2015   PROT 7.8 05/03/2023   BILITOT 0.5 05/03/2023   ALT 44 05/03/2023   AST 25 05/03/2023   ALKPHOS 95 05/03/2023   Other Labs: bhCG: 5,816 : Other Imaging: US OB LESS THAN 14 WEEKS WITH OB TRANSVAGINAL  Result Date: 05/03/2023 CLINICAL DATA:  Abdominal pain affecting pregnancy EXAM: OBSTETRIC <14 WK [place] AND TRANSVAGINAL OB US TECHNIQUE: Both transabdominal and transvaginal ultrasound examinations were performed for complete evaluation of the gestation as well as the maternal uterus, adnexal regions, and pelvic cul-de-sac. Transvaginal technique was performed to assess early pregnancy. COMPARISON:  None Available. FINDINGS: Intrauterine gestational sac: None Yolk sac:  Visualized. Embryo:  Not Visualized. Cardiac Activity: Not Visualized. Heart Rate:   bpm MSD: 7.4 mm   5 w   3 d CRL:    mm    w    d                  [place] EDC: Subchorionic hemorrhage:  None visualized. Maternal uterus/adnexae: No intrauterine gestation. Within the right adnexa adjacent to the right ovary is a gestational sac containing a yolk sac. Overall, right adnexal mass measures 1.8 x 1.6 x 2.0 cm. No fetal pole currently. Moderate free fluid in the pelvis. IMPRESSION: No intrauterine gestation. Right adnexal mass compatible with ectopic pregnancy. Gestational sac containing a yolk sac but no fetal pole. Moderate free fluid in the pelvis. Critical Value/emergent results were called by telephone at the time of interpretation on 05/03/2023 at 8:26 pm to provider [facility] , who verbally acknowledged these results. Electronically Signed   By: Charlett Nose M.D.   On: 05/03/2023 20:26     Assessment / Plan:   DYLANNE HUSA is a 32 y.o. G1P1001 who presents with ectopic pregnancy  -Consulted Dr. Lonny Prude on management for this patient. Thorough discussion with Rossana about options of surgical management versus methotrexate.  Reviewed risks and benefits of both options. Discussed risks of methotrexate, including failure of treatment given hcg level and yolk sac present, future need for repeat dose or surgical intervention, rupture of ectopic, and the importance of close follow up. Discussed the risks of surgical intervention, including possible loss of ovary/tube, pain, and need for general anesthesia.  Dereona discussed this with her mother and has decided to proceed with methotrexate. CBC and CMP stable Orders placed. Instructions given for follow up on days 4 and 7, as well as anticipatory guidance about pain from  methotrexate vs s/s of rupture or bleeding.  -Tylenol for pain management (avoid NSAIDs) -D/c any folic acid  Thank you for the opportunity to be involved with this pt's care.    I spent 30 minutes involved in the care of this patient preparing to see the patient by obtaining and reviewing her medical history (including labs, imaging tests and prior procedures), documenting clinical information in the electronic health record (EHR), counseling and coordinating care plans, writing and sending prescriptions, ordering tests or procedures and in direct communicating with the patient and medical staff discussing pertinent items from her history and physical exam.   Glenetta Borg, CNM

## 2023-05-03 NOTE — Discharge Instructions (Addendum)
Take tylenol 1g every 8 hours for one week. NO NSAIDs, ibuprofen, BC powder, aspirin.  Stop taking any folic acid/prenatal vitamin containing folic acid. I've messaged the offce to get her in for blood draws on Friday and Monday. Return to ER for worsening pain, bleeding or any other concerns.

## 2023-05-03 NOTE — ED Provider Notes (Addendum)
[facility] Provider Note    Event Date/Time   First MD Initiated Contact with Patient 05/03/23 1533     (approximate)   History   Abdominal Pain   HPI  Felicia Castillo is a 32 y.o. female who comes in with lower abdominal cramping and vaginal bleeding.  Patient reports that she finished her period on the third of this month but she states continued to have bright red blood going through about 2 pads a day.  She reports that this is irregular for her to have some continued bleeding after her period finishes.  She reports some lower abdominal cramping associated with it.  She reports being sexually active with her fianc denies any obvious new STDs or vaginal discharge.  She does not use anything for contraception.       Physical Exam   Triage Vital Signs: ED Triage Vitals [05/03/23 1216]  Encounter Vitals Group     BP 116/76     Systolic BP Percentile      Diastolic BP Percentile      Pulse Rate 64     Resp 18     Temp 98.4 F (36.9 C)     Temp Source Oral     SpO2 98 %     Weight 132 lb (59.9 kg)     Height 5\' 7"  (1.702 m)     Head Circumference      Peak Flow      Pain Score 2     Pain Loc      Pain Education      Exclude from Growth Chart     Most recent vital signs: Vitals:   05/03/23 1216  BP: 116/76  Pulse: 64  Resp: 18  Temp: 98.4 F (36.9 C)  SpO2: 98%     General: Awake, no distress.  CV:  Good peripheral perfusion.  Resp:  Normal effort.  Abd:  No distention. Soft and non tender  Other:     ED Results / Procedures / Treatments   Labs (all labs ordered are listed, but only abnormal results are displayed) Labs Reviewed  CBC - Abnormal; Notable for the following components:      Result Value   Hemoglobin 10.1 (*)    HCT 31.5 (*)    MCV 73.9 (*)    MCH 23.7 (*)    All other components within normal limits  LIPASE, BLOOD  COMPREHENSIVE METABOLIC PANEL  URINALYSIS, ROUTINE W REFLEX MICROSCOPIC  HCG,  QUANTITATIVE, PREGNANCY  POC URINE PREG, ED  TYPE AND SCREEN      RADIOLOGY I have reviewed the [place] personally and no intrauterine preg  PROCEDURES:  Critical Care performed: No  Procedures   MEDICATIONS ORDERED IN ED: Medications  methotrexate (for ectopic pregnancy) 25 mg/mL chemo injection (has no administration in time range)     IMPRESSION / MDM / ASSESSMENT AND PLAN / ED COURSE  I reviewed the triage vital signs and the nursing notes.   Patient's presentation is most consistent with acute presentation with potential threat to life or bodily function.    Patient comes in with vaginal bleeding and some abdominal cramping.  Differential includes ectopic, miscarriage, early pregnancy.  STD screening negative.  Urine is negative.  CMP reassuring CBC shows hemoglobin of 10.1 similar to her priors.  Patient is O+.  hCG 5000.  IMPRESSION: No intrauterine gestation.   Right adnexal mass compatible with ectopic pregnancy. Gestational sac containing a yolk sac but  no fetal pole. Moderate free fluid in the pelvis.   Critical Value/emergent results were called by telephone at the time of interpretation on 05/03/2023 at 8:26 pm to provider [facility] , who verbally acknowledged these results.  Discussed with OBGYN melissa swanson-they have come down to evaluate patient and offered her surgery versus methotrexate.  Patient prefer to trial methotrexate.  I discussed with patient return precautions including worsening pain, worsening bleeding and she expressed understanding.  We discussed the ectopics can be life-threatening and so if she has a change in symptoms that she needs to return to the ER immediately.  We discussed holding off on ibuprofen, holding prenatals, and following up for the repeat hCG levels outpatient.  11:27 PM  Repeat abdominal exam is soft and nontender she remains hemodynamically stable and she reports only minimal bleeding at this time.   I left prior  to methotrexate being administered but patient expressed understanding to the above prior to me leaving.  FINAL CLINICAL IMPRESSION(S) / ED DIAGNOSES   Final diagnoses:  Right ovarian pregnancy without intrauterine pregnancy     Rx / DC Orders   ED Discharge Orders          Ordered    Human Chorionic Gonadotropin (hCG),Quantitative (Serial Monitor)        05/03/23 2249    Human Chorionic Gonadotropin (hCG),Quantitative (Serial Monitor)        05/03/23 2249             Note:  This document was prepared using Dragon voice recognition software and may include unintentional dictation errors.   Concha Se, MD 05/03/23 2440    Concha Se, MD 05/03/23 [phone]

## 2023-05-04 ENCOUNTER — Telehealth: Payer: Self-pay | Admitting: Obstetrics

## 2023-05-04 NOTE — Telephone Encounter (Addendum)
Felicia Castillo needs lab-only appts for methotrexate f/u on Friday, 12/13 and Monday,12/16 (must be both of those days). I contact the patient via phone. No answer, voicemail is full.

## 2023-05-06 ENCOUNTER — Encounter
Admission: EM | Disposition: A | Discharge status: Home / Self Care | Payer: Self-pay | Source: Home / Self Care | Attending: Emergency Medicine

## 2023-05-06 ENCOUNTER — Other Ambulatory Visit: Payer: MEDICAID

## 2023-05-06 ENCOUNTER — Other Ambulatory Visit: Payer: Self-pay

## 2023-05-06 ENCOUNTER — Ambulatory Visit
Admission: EM | Admit: 2023-05-06 | Discharge: 2023-05-06 | Disposition: A | Discharge status: Home / Self Care | Payer: MEDICAID | Attending: Emergency Medicine | Admitting: Emergency Medicine

## 2023-05-06 ENCOUNTER — Emergency Department: Payer: MEDICAID | Admitting: Anesthesiology

## 2023-05-06 ENCOUNTER — Emergency Department: Payer: MEDICAID

## 2023-05-06 DIAGNOSIS — N7011 Chronic salpingitis: Secondary | ICD-10-CM | POA: Diagnosis not present

## 2023-05-06 DIAGNOSIS — O080 Genital tract and pelvic infection following ectopic and molar pregnancy: Secondary | ICD-10-CM | POA: Insufficient documentation

## 2023-05-06 DIAGNOSIS — O00201 Right ovarian pregnancy without intrauterine pregnancy: Secondary | ICD-10-CM | POA: Diagnosis not present

## 2023-05-06 DIAGNOSIS — Z3A01 Less than 8 weeks gestation of pregnancy: Secondary | ICD-10-CM | POA: Diagnosis not present

## 2023-05-06 DIAGNOSIS — Z3A00 Weeks of gestation of pregnancy not specified: Secondary | ICD-10-CM | POA: Diagnosis not present

## 2023-05-06 DIAGNOSIS — O00101 Right tubal pregnancy without intrauterine pregnancy: Secondary | ICD-10-CM | POA: Insufficient documentation

## 2023-05-06 HISTORY — PX: LAPAROSCOPY: SHX197

## 2023-05-06 HISTORY — PX: LAPAROSCOPIC UNILATERAL SALPINGECTOMY: SHX5934

## 2023-05-06 LAB — BASIC METABOLIC PANEL
Anion gap: 8 (ref 5–15)
BUN: 13 mg/dL (ref 6–20)
CO2: 24 mmol/L (ref 22–32)
Calcium: 9.1 mg/dL (ref 8.9–10.3)
Chloride: 103 mmol/L (ref 98–111)
Creatinine, Ser: 0.61 mg/dL (ref 0.44–1.00)
GFR, Estimated: >60 mL/min (ref >60)
Glucose, Bld: 120 mg/dL — ABNORMAL HIGH (ref 70–99)
Potassium: 3.5 mmol/L (ref 3.5–5.1)
Sodium: 135 mmol/L (ref 135–145)

## 2023-05-06 LAB — CBC
HCT: 36.4 % (ref 36.0–46.0)
Hemoglobin: 11.2 g/dL — ABNORMAL LOW (ref 12.0–15.0)
MCH: 22.9 pg — ABNORMAL LOW (ref 26.0–34.0)
MCHC: 30.8 g/dL (ref 30.0–36.0)
MCV: 74.4 fL — ABNORMAL LOW (ref 80.0–100.0)
Platelets: 277 10*3/uL (ref 150–400)
RBC: 4.89 MIL/uL (ref 3.87–5.11)
RDW: 15.1 % (ref 11.5–15.5)
WBC: 5.4 10*3/uL (ref 4.0–10.5)
nRBC: 0 % (ref 0.0–0.2)

## 2023-05-06 LAB — TYPE AND SCREEN
ABO/RH(D): O POS
Antibody Screen: NEGATIVE

## 2023-05-06 LAB — HCG, QUANTITATIVE, PREGNANCY: hCG, Beta Chain, Quant, S: 8589 m[IU]/mL — ABNORMAL HIGH (ref <5)

## 2023-05-06 SURGERY — LAPAROSCOPY, DIAGNOSTIC
Anesthesia: General

## 2023-05-06 MED ORDER — MIDAZOLAM HCL 2 MG/2ML IJ SOLN
INTRAMUSCULAR | Status: AC
  Filled 2023-05-06: qty 2

## 2023-05-06 MED ORDER — DEXAMETHASONE SODIUM PHOSPHATE 10 MG/ML IJ SOLN
INTRAMUSCULAR | Status: DC | PRN
Start: 2023-05-06 — End: 2023-05-06
  Administered 2023-05-06: 10 mg via INTRAVENOUS

## 2023-05-06 MED ORDER — ROCURONIUM BROMIDE 100 MG/10ML IV SOLN
INTRAVENOUS | Status: DC | PRN
  Administered 2023-05-06: 50 mg via INTRAVENOUS

## 2023-05-06 MED ORDER — FENTANYL CITRATE (PF) 100 MCG/2ML IJ SOLN: 25.0000 ug | INTRAMUSCULAR | Status: DC | PRN

## 2023-05-06 MED ORDER — ONDANSETRON 4 MG PO TBDP: 4.0000 mg | ORAL_TABLET | Freq: Four times a day (QID) | ORAL | Status: DC | PRN

## 2023-05-06 MED ORDER — PROPOFOL 10 MG/ML IV BOLUS
INTRAVENOUS | Status: DC | PRN
  Administered 2023-05-06: 150 mg via INTRAVENOUS

## 2023-05-06 MED ORDER — MIDAZOLAM HCL 2 MG/2ML IJ SOLN
INTRAMUSCULAR | Status: DC | PRN
  Administered 2023-05-06: 2 mg via INTRAVENOUS

## 2023-05-06 MED ORDER — FENTANYL CITRATE PF 50 MCG/ML IJ SOSY
25.0000 ug | PREFILLED_SYRINGE | Freq: Once | INTRAMUSCULAR | Status: AC
  Administered 2023-05-06: 25 ug via INTRAVENOUS
  Filled 2023-05-06: qty 1

## 2023-05-06 MED ORDER — ONDANSETRON HCL 4 MG/2ML IJ SOLN
4.0000 mg | Freq: Once | INTRAMUSCULAR | Status: AC
  Administered 2023-05-06: 4 mg via INTRAVENOUS
  Filled 2023-05-06: qty 2

## 2023-05-06 MED ORDER — HYDROCODONE-ACETAMINOPHEN 5-325 MG PO TABS: 1.0000 | ORAL_TABLET | Freq: Four times a day (QID) | ORAL | 0 refills | Status: DC | PRN

## 2023-05-06 MED ORDER — FENTANYL CITRATE (PF) 100 MCG/2ML IJ SOLN
INTRAMUSCULAR | Status: DC | PRN
  Administered 2023-05-06: 100 ug via INTRAVENOUS

## 2023-05-06 MED ORDER — DROPERIDOL 2.5 MG/ML IJ SOLN: 0.6250 mg | Freq: Once | INTRAMUSCULAR | Status: DC | PRN

## 2023-05-06 MED ORDER — KETOROLAC TROMETHAMINE 30 MG/ML IJ SOLN
INTRAMUSCULAR | Status: AC
  Filled 2023-05-06: qty 1

## 2023-05-06 MED ORDER — FENTANYL CITRATE (PF) 100 MCG/2ML IJ SOLN
INTRAMUSCULAR | Status: AC
  Filled 2023-05-06: qty 2

## 2023-05-06 MED ORDER — ONDANSETRON HCL 4 MG/2ML IJ SOLN: 4.0000 mg | Freq: Four times a day (QID) | INTRAMUSCULAR | Status: DC | PRN

## 2023-05-06 MED ORDER — DEXMEDETOMIDINE HCL IN NACL 80 MCG/20ML IV SOLN
INTRAVENOUS | Status: DC | PRN
Start: 2023-05-06 — End: 2023-05-06
  Administered 2023-05-06: 12 ug via INTRAVENOUS
  Administered 2023-05-06: 8 ug via INTRAVENOUS

## 2023-05-06 MED ORDER — KETOROLAC TROMETHAMINE 30 MG/ML IJ SOLN
30.0000 mg | Freq: Once | INTRAMUSCULAR | Status: AC
  Administered 2023-05-06: 30 mg via INTRAVENOUS

## 2023-05-06 MED ORDER — DEXTROSE IN LACTATED RINGERS 5 % IV SOLN: INTRAVENOUS | Status: DC

## 2023-05-06 MED ORDER — OXYCODONE-ACETAMINOPHEN 5-325 MG PO TABS
1.0000 | ORAL_TABLET | ORAL | Status: DC | PRN
Start: 2023-05-06 — End: 2023-05-07
  Administered 2023-05-06: 1 via ORAL

## 2023-05-06 MED ORDER — 0.9 % SODIUM CHLORIDE (POUR BTL) OPTIME
TOPICAL | Status: DC | PRN
  Administered 2023-05-06: 500 mL

## 2023-05-06 MED ORDER — LACTATED RINGERS IV SOLN: INTRAVENOUS | Status: DC | PRN

## 2023-05-06 MED ORDER — ONDANSETRON HCL 4 MG/2ML IJ SOLN
INTRAMUSCULAR | Status: DC | PRN
  Administered 2023-05-06: 4 mg via INTRAVENOUS

## 2023-05-06 MED ORDER — SUGAMMADEX SODIUM 200 MG/2ML IV SOLN
INTRAVENOUS | Status: DC | PRN
Start: 2023-05-06 — End: 2023-05-06
  Administered 2023-05-06: 200 mg via INTRAVENOUS

## 2023-05-06 MED ORDER — ONDANSETRON HCL 4 MG/2ML IJ SOLN
INTRAMUSCULAR | Status: AC
  Filled 2023-05-06: qty 2

## 2023-05-06 MED ORDER — BUPIVACAINE HCL 0.5 % IJ SOLN
INTRAMUSCULAR | Status: DC | PRN
  Administered 2023-05-06: 16 mL

## 2023-05-06 MED ORDER — LIDOCAINE HCL (PF) 2 % IJ SOLN
INTRAMUSCULAR | Status: DC | PRN
  Administered 2023-05-06: 100 mg via INTRADERMAL

## 2023-05-06 MED ORDER — OXYCODONE-ACETAMINOPHEN 5-325 MG PO TABS
ORAL_TABLET | ORAL | Status: AC
  Filled 2023-05-06: qty 1

## 2023-05-06 SURGICAL SUPPLY — 45 items
BAG DECANTER FOR FLEXI CONT (MISCELLANEOUS) ×1 IMPLANT
BLADE SURG 15 STRL LF DISP TIS (BLADE) ×1 IMPLANT
BLADE SURG SZ11 CARB STEEL (BLADE) ×1 IMPLANT
CATH ROBINSON RED A/P 16FR (CATHETERS) ×1 IMPLANT
CHLORAPREP W/TINT 26 (MISCELLANEOUS) ×1 IMPLANT
DERMABOND ADVANCED .7 DNX12 (GAUZE/BANDAGES/DRESSINGS) IMPLANT
ELECT REM PT RETURN 9FT ADLT (ELECTROSURGICAL) ×1
ELECTRODE REM PT RTRN 9FT ADLT (ELECTROSURGICAL) ×1 IMPLANT
GAUZE 4X4 16PLY ~~LOC~~+RFID DBL (SPONGE) ×1 IMPLANT
GLOVE BIO SURGEON STRL SZ8 (GLOVE) ×1 IMPLANT
GLOVE PI ORTHO PRO STRL 7.5 (GLOVE) ×2 IMPLANT
GOWN STRL REUS W/ TWL LRG LVL3 (GOWN DISPOSABLE) ×2 IMPLANT
GOWN STRL REUS W/TWL XL LVL4 (GOWN DISPOSABLE) ×2 IMPLANT
GRASPER SUT TROCAR 14GX15 (MISCELLANEOUS) IMPLANT
IRRIGATION STRYKERFLOW (MISCELLANEOUS) IMPLANT
IRRIGATOR STRYKERFLOW (MISCELLANEOUS) ×1
IV LACTATED RINGERS 1000ML (IV SOLUTION) ×1 IMPLANT
KIT PINK PAD W/HEAD ARE REST (MISCELLANEOUS) ×1 IMPLANT
KIT PINK PAD W/HEAD ARM REST (MISCELLANEOUS) ×1 IMPLANT
KIT TURNOVER CYSTO (KITS) ×1 IMPLANT
LIGASURE LAP MARYLAND 5MM 37CM (ELECTROSURGICAL) IMPLANT
MANIFOLD NEPTUNE II (INSTRUMENTS) ×1 IMPLANT
NDL HYPO 22X1.5 SAFETY MO (MISCELLANEOUS) ×1 IMPLANT
NEEDLE HYPO 22X1.5 SAFETY MO (MISCELLANEOUS) ×1 IMPLANT
NS IRRIG 500ML POUR BTL (IV SOLUTION) ×1 IMPLANT
PACK GYN LAPAROSCOPIC (MISCELLANEOUS) ×1 IMPLANT
PAD OB MATERNITY 4.3X12.25 (PERSONAL CARE ITEMS) ×1 IMPLANT
PAD PREP OB/GYN DISP 24X41 (PERSONAL CARE ITEMS) ×1 IMPLANT
SCRUB CHG 4% DYNA-HEX 4OZ (MISCELLANEOUS) ×1 IMPLANT
SET TUBE SMOKE EVAC HIGH FLOW (TUBING) ×1 IMPLANT
SLEEVE Z-THREAD 5X100MM (TROCAR) IMPLANT
STRIP CLOSURE SKIN 1/2X4 (GAUZE/BANDAGES/DRESSINGS) ×1 IMPLANT
SUT VIC AB 3-0 SH 27X BRD (SUTURE) IMPLANT
SUT VICRYL 0 UR6 27IN ABS (SUTURE) ×1 IMPLANT
SUT VICRYL 4-0 27 PS-2 BARIAT (SUTURE) ×1
SUTURE VICRYL 4-0 27 PS-2 BART (SUTURE) ×1 IMPLANT
SYS BAG RETRIEVAL 10MM (BASKET) ×1
SYSTEM BAG RETRIEVAL 10MM (BASKET) ×1 IMPLANT
TOWEL OR 17X26 4PK STRL BLUE (TOWEL DISPOSABLE) ×1 IMPLANT
TRAP FLUID SMOKE EVACUATOR (MISCELLANEOUS) ×1 IMPLANT
TROCAR XCEL UNIV SLVE 11M 100M (ENDOMECHANICALS) IMPLANT
TROCAR Z-THRD FIOS HNDL 11X100 (TROCAR) IMPLANT
TROCAR Z-THREAD FIOS 5X100MM (TROCAR) ×1 IMPLANT
TUBING CONNECTING 10 (TUBING) ×1 IMPLANT
WATER STERILE IRR 500ML POUR (IV SOLUTION) ×1 IMPLANT

## 2023-05-06 NOTE — ED Notes (Signed)
Pt states some light red blood this morning. Pt states pain is 10/10.

## 2023-05-06 NOTE — ED Notes (Signed)
MD Funke at bedside 

## 2023-05-06 NOTE — Progress Notes (Signed)
Per Dr. Logan Bores, no laterality required on consent form.

## 2023-05-06 NOTE — Telephone Encounter (Signed)
It looks like she got her hcg drawn today already. It is very important that she get her hcg drawn again on Monday since we are following her after having methotrexate. She can have it done here or at the hospital, but it needs to be done.  Missy

## 2023-05-06 NOTE — ED Provider Notes (Signed)
[facility] Provider Note    Event Date/Time   First MD Initiated Contact with Patient 05/06/23 1006     (approximate)   History   Abdominal Pain   HPI  Felicia Castillo is a 32 y.o. female with history of recent ectopic pregnancy seen by myself who comes in with recurrent symptoms of abdominal pain.  Patient reports that she was post to come in today for repeat methotrexate.  Explained to patient that it was for a repeat hCG level.  However regardless patient states that she was not able to come in secondary to the worsening pain.  She reports the pain is calm down a little but now she is not sure if it is cramping or if it something more serious going on.  She reports that her bleeding is about the same and very light in nature.  No pad greater than 1 hour.  Physical Exam   Triage Vital Signs: ED Triage Vitals  Encounter Vitals Group     BP 05/06/23 1006 113/70     Systolic BP Percentile --      Diastolic BP Percentile --      Pulse Rate 05/06/23 1006 88     Resp 05/06/23 1006 18     Temp 05/06/23 1006 97.6 F (36.4 C)     Temp src --      SpO2 05/06/23 1006 100 %     Weight 05/06/23 1005 132 lb (59.9 kg)     Height 05/06/23 1005 5\' 7"  (1.702 m)     Head Circumference --      Peak Flow --      Pain Score 05/06/23 1005 10     Pain Loc --      Pain Education --      Exclude from Growth Chart --     Most recent vital signs: Vitals:   05/06/23 1006  BP: 113/70  Pulse: 88  Resp: 18  Temp: 97.6 F (36.4 C)  SpO2: 100%     General: Awake, no distress.  CV:  Good peripheral perfusion.  Resp:  Normal effort.  Abd:  No distention.  Slight tenderness in the right lower quadrant without any rebound or guarding Other:     ED Results / Procedures / Treatments   Labs (all labs ordered are listed, but only abnormal results are displayed) Labs Reviewed  CBC - Abnormal; Notable for the following components:      Result Value   Hemoglobin  11.2 (*)    MCV 74.4 (*)    MCH 22.9 (*)    All other components within normal limits  BASIC METABOLIC PANEL - Abnormal; Notable for the following components:   Glucose, Bld 120 (*)    All other components within normal limits  HCG, QUANTITATIVE, PREGNANCY  TYPE AND SCREEN     RADIOLOGY I have reviewed the ultrasound personally interpreted and patient has right-sided ectopic pregnancy   PROCEDURES:  Critical Care performed: No  .Critical Care  Performed by: Concha Se, MD Authorized by: Concha Se, MD   Critical care provider statement:    Critical care time (minutes):  30   Critical care was necessary to treat or prevent imminent or life-threatening deterioration of the following conditions: ectopic preg.   Critical care was time spent personally by me on the following activities:  Development of treatment plan with patient or surrogate, discussions with consultants, evaluation of patient's response to treatment, examination of patient,  ordering and review of laboratory studies, ordering and review of radiographic studies, ordering and performing treatments and interventions, pulse oximetry, re-evaluation of patient's condition and review of old charts    MEDICATIONS ORDERED IN ED: Medications  fentaNYL (SUBLIMAZE) injection 25 mcg (has no administration in time range)  ondansetron (ZOFRAN) injection 4 mg (has no administration in time range)     IMPRESSION / MDM / ASSESSMENT AND PLAN / ED COURSE  I reviewed the triage vital signs and the nursing notes.   Patient's presentation is most consistent with acute presentation with potential threat to life or bodily function.   Patient comes in with known ectopic pregnancy with worsening pain.  Patient given some IV fentanyl IV Zofran to help with symptoms.  Will get repeat hCG to assess as well as repeat ultrasound.  I paged [facility] but did not get a page back given she is stable we can wait for the results to come  back and we will reattempt to try to get a hold of them.  BMP stable.  CBC shows stable hemoglobin  *Continued growth of viable patient's known right adnexal ectopic pregnancy. *No intrauterine pregnancy seen.  Left corpus luteal cyst noted.  Called over to lab to try to figure out what the delay was and they stated that there was an issue with her instrument.  Trying to get the hCG level now  hCG level is increasing and her ultrasound is as above.  I discussed the case with OB/GYN team.  Per the midwife patient will most likely go to the OR.  Patient remains n.p.o. since yesterday.  Patient instructed to remain n.p.o. and understands the above plan  The patient is on the cardiac monitor to evaluate for evidence of arrhythmia and/or significant heart rate changes.      FINAL CLINICAL IMPRESSION(S) / ED DIAGNOSES   Final diagnoses:  Right ovarian pregnancy without intrauterine pregnancy     Rx / DC Orders   ED Discharge Orders     None        Note:  This document was prepared using Dragon voice recognition software and may include unintentional dictation errors.   Concha Se, MD 05/06/23 [id number]

## 2023-05-06 NOTE — Telephone Encounter (Signed)
Contacted the patient via phone, she had went to the hospital and missed her lab appointment. The patient is scheduled for Monday. Please advise if she needs to keep these lab appointments?

## 2023-05-06 NOTE — ED Notes (Signed)
Called lab to check on hcg result. Lab states that the machine stopped and will have to be restarted and that it will be about 30 minutes. MD notified.

## 2023-05-06 NOTE — ED Notes (Signed)
Report given to Megan RN

## 2023-05-06 NOTE — Anesthesia Procedure Notes (Signed)
Procedure Name: Intubation Date/Time: 05/06/2023 4:19 PM  Performed by: Maryla Morrow., CRNAPre-anesthesia Checklist: Patient identified, Patient being monitored, Timeout performed, Emergency Drugs available and Suction available Patient Re-evaluated:Patient Re-evaluated prior to induction Oxygen Delivery Method: Circle system utilized Preoxygenation: Pre-oxygenation with 100% oxygen Induction Type: IV induction Ventilation: Mask ventilation without difficulty Laryngoscope Size: 3 and [place]ath Grade View: Grade I Tube type: Oral Tube size: 7.0 mm Number of attempts: 1 Airway Equipment and Method: Stylet Placement Confirmation: ETT inserted through vocal cords under direct vision, positive ETCO2 and breath sounds checked- equal and bilateral Secured at: 21 cm Tube secured with: Tape Dental Injury: Teeth and Oropharynx as per pre-operative assessment

## 2023-05-06 NOTE — Transfer of Care (Signed)
Immediate Anesthesia Transfer of Care Note  Patient: Felicia Castillo  Procedure(s) Performed: LAPAROSCOPY DIAGNOSTIC  Patient Location: PACU  Anesthesia Type:General  Level of Consciousness: awake, drowsy, and patient cooperative  Airway & Oxygen Therapy: Patient Spontanous Breathing and Patient connected to face mask oxygen  Post-op Assessment: Report given to RN and Post -op Vital signs reviewed and stable  Post vital signs: Reviewed and stable  Last Vitals:  Vitals Value Taken Time  BP 124/86 05/06/23 1738  Temp 36.1 C 05/06/23 1738  Pulse 69 05/06/23 1745  Resp 16 05/06/23 1745  SpO2 100 % 05/06/23 1745  Vitals shown include unfiled device data.  Last Pain:  Vitals:   05/06/23 1738  TempSrc:   PainSc: Asleep         Complications: No notable events documented.

## 2023-05-06 NOTE — H&P (Signed)
PRE-OPERATIVE HISTORY AND PHYSICAL EXAM  PCP:  Norm Salt, PA Subjective:   HPI:  Felicia Castillo is a 32 y.o. G2P1001.  No LMP recorded. Patient is pregnant.  She presents today for a pre-op discussion and PE.  She has the following symptoms:  Viable ectopic pregnancy -  abd pain  Review of Systems:   Constitutional: Denied constitutional symptoms, night sweats, recent illness, fatigue, fever, insomnia and weight loss.  Eyes: Denied eye symptoms, eye pain, photophobia, vision change and visual disturbance.  Ears/Nose/Throat/Neck: Denied ear, nose, throat or neck symptoms, hearing loss, nasal discharge, sinus congestion and sore throat.  Cardiovascular: Denied cardiovascular symptoms, arrhythmia, chest pain/pressure, edema, exercise intolerance, orthopnea and palpitations.  Respiratory: Denied pulmonary symptoms, asthma, pleuritic pain, productive sputum, cough, dyspnea and wheezing.  Gastrointestinal: Denied, gastro-esophageal reflux, melena, nausea and vomiting.  Genitourinary: Denied genitourinary symptoms including symptomatic vaginal discharge, pelvic relaxation issues, and urinary complaints.  Musculoskeletal: Denied musculoskeletal symptoms, stiffness, swelling, muscle weakness and myalgia.  Dermatologic: Denied dermatology symptoms, rash and scar.  Neurologic: Denied neurology symptoms, dizziness, headache, neck pain and syncope.  Psychiatric: Denied psychiatric symptoms, anxiety and depression.  Endocrine: Denied endocrine symptoms including hot flashes and night sweats.   OB History  Gravida Para Term Preterm AB Living  2 1 1   1   SAB IAB Ectopic Multiple Live Births      1    # Outcome Date GA Lbr Len/2nd Weight Sex Type Anes PTL Lv  2 Current           1 Term 03/03/08 [redacted]w[redacted]d  3657 g F Vag-Spont None  LIV    Past Medical History:  Diagnosis Date   Asthma    as a child   Bacterial vaginosis    Candidiasis of vulva and vagina 10/30/2013    Headache(784.0)    Herpes genitalis    Unspecified symptom associated with female genital organs 10/10/2013   Yeast infection 02/23/2013    Past Surgical History:  Procedure Laterality Date   NO PAST SURGERIES        SOCIAL HISTORY:  Social History   Tobacco Use  Smoking Status Never  Smokeless Tobacco Never   Social History   Substance and Sexual Activity  Alcohol Use No    Social History   Substance and Sexual Activity  Drug Use No    Family History  Problem Relation Age of Onset   Depression Mother    Diabetes Father    Hypertension Father    Depression Brother    Kidney disease Maternal Grandmother    Kidney disease Paternal Grandmother     ALLERGIES:  Amoxicillin  MEDS:   No current facility-administered medications on file prior to encounter.   Current Outpatient Medications on File Prior to Encounter  Medication Sig Dispense Refill   metroNIDAZOLE (METROGEL) 1 % gel Apply topically daily. 45 g 0    Meds ordered this encounter  Medications   fentaNYL (SUBLIMAZE) injection 25 mcg   ondansetron (ZOFRAN) injection 4 mg     Physical examination BP 119/68   Pulse 72   Temp 97.6 F (36.4 C)   Resp 16   Ht 5\' 7"  (1.702 m)   Wt 59.9 kg   SpO2 100%   BMI 20.67 kg/m   General NAD, Conversant  HEENT Atraumatic; Op clear with mmm.  Normo-cephalic.  Anicteric sclerae  Thyroid/Neck Smooth without nodularity or enlargement. Normal ROM.  Neck Supple.  Skin No  rashes, lesions or ulceration. Normal palpated skin turgor. No nodularity.  Breasts: No masses or discharge.  Symmetric.  No axillary adenopathy.  Lungs: Clear to auscultation.No rales or wheezes. Normal Respiratory effort, no retractions.  Heart: NSR.  No murmurs or rubs appreciated. No peripheral edema  Abdomen: Soft.  Mod tenderness with palpation  Extremities: Moves all appropriately.  Normal ROM for age. No lymphadenopathy.  Neuro: Oriented to PPT.  Normal mood. Normal affect.     Pelvic:  See [place] for dx and appearance     Assessment:   G2P1001 Patient Active Problem List   Diagnosis Date Noted   Ectopic pregnancy of right ovary 05/03/2023   Moderate dysplasia of cervix 10/30/2013   Abnormal uterine bleeding 06/07/2013   LSIL (low grade squamous intraepithelial lesion) on Pap smear 03/01/2013   Hemorrhoids 10/12/2012    1. Right ovarian pregnancy without intrauterine pregnancy      Plan:   Orders: Meds ordered this encounter  Medications   fentaNYL (SUBLIMAZE) injection 25 mcg   ondansetron (ZOFRAN) injection 4 mg     1.  Lpy removal of ectopic, possible salpingectomy, possible oophorectomy  Pre-op discussions regarding Risks and Benefits of her scheduled surgery.  Discussed in detail with patient.  R/B reviewed. Necessity of surgery discussed.  Elonda Husky, M.D. 05/06/2023 3:02 PM

## 2023-05-06 NOTE — ED Triage Notes (Addendum)
Pt comes via EMs from home with c/o etopic pregnancy. Pt was given methotrexate shot yesterday and suppose to have 2nd one today. Pt states she was in too much pain and came here.   VSs  Md Bradler at bedside.

## 2023-05-06 NOTE — ED Notes (Signed)
Pt in US at this time 

## 2023-05-06 NOTE — Op Note (Signed)
OPERATIVE NOTE 05/06/2023 5:23 PM  PRE-OPERATIVE DIAGNOSIS:  1) ectopic pregnancy  POST-OPERATIVE DIAGNOSIS:  1) Right tubal ectopic 2) Left clubbed and adhesed tube 3) Numerous pelvic adhesion  OPERATION:  LAPAROSCOPY DIAGNOSTIC: 49320 (CPT)  SURGEON(S): Surgeons and Role:    Linzie Collin, MD - Primary   ANESTHESIA: General  ESTIMATED BLOOD LOSS: 80mL  OPERATIVE FINDINGS: Uterus significantly retroverted and adhesed in the pelvis with multiple adhesions some thick and some filmy and fine.  Left fallopian tube obviously clubbed and adhesed to the left sidewall and the cul-de-sac.  Left ovary encased in adhesions.  Right ovary encased in adhesions.  Right fallopian tube massively dilated and adhesed to the right sidewall.  Small amount of hemoperitoneum.  SPECIMEN:  ID Type Source Tests Collected by Time Destination  1 : right tube Tissue PATH Gyn benign resection SURGICAL PATHOLOGY Linzie Collin, MD 05/06/2023 1709     COMPLICATIONS: None  DISPOSITION: Stable to recovery room  DESCRIPTION OF PROCEDURE:      The patient was prepped and draped in the dorsolithotomy position and placed under general anesthesia. The bladder was emptied. The cervix was grasped with a multi-toothed tenaculum and a uterine manipulator was placed within the cervical os respecting the position and curvature of the uterus. After changing gloves we proceeded abdominally. A small infraumbilical incision was made and a 5 mm trocar port was placed within the abdominopelvic cavity. The opening pressure was less than 7 mmHg.  Approximately 3 and 1/2 L of carbon dioxide gas was instilled within the abdominal pelvic cavity. The laparoscope was placed and the pelvis and abdomen were carefully inspected. Right and left lower quadrant ports were placed in the usual manner.  The left lower quadrant port was 12 mm in size.  We encountered hemoperitoneum and immediately noted massive pelvic adhesions  scarring the uterus and the retroeverted position.  Using the LigaSure and by raising the uterus out of the cul-de-sac were able to systematically lysed many of the pelvic adhesions holding the uterus in place.  This allowed [place] to better visualize the fallopian tubes.  Left fallopian tube was noted to be dilated and clubbed.  The left ovary was [place] adhesions.  The adhesions were systematically removed from the ovary.  We were unable to dissect the left fallopian tube out of the cul-de-sac off the sidewall because of its proximity to the ureter and its dense adhesion.  We turned our attention to the right fallopian tube he was noted to be massively dilated and was suspected to to contain the ectopic pregnancy based on the previous ultrasound.  The right ovary was noted to be encased in adhesions and these were systematically lysed using the LigaSure.  The right tube was attached to the right sidewall but not as densely as the left.  Using blunt and sharp dissection we are able to remove the right fallopian tube from the right sidewall.  On the mesenteric side of the fallopian tube we were able to make multiple burns and cuts and eventually divide the proximal end of the fallopian tube releasing it from its attachments.  Hemostasis was noted from all adhesions and all pedicle sites.  Suction was used to remove all hemoperitoneum.  Hemostasis was again checked for found to be excellent.  The left lower quadrant port was removed.  A PMI was placed and used to close the fascia for this large port.  The the right lower quadrant port was removed  hemostasis was noted.  The gas was allowed to escape from the abdomen and pelvis and the midline port was removed.  All port sites were closed with a subcuticular suture followed by Dermabond.  A long-acting anesthetic was injected. The uterine manipulator was removed. The patient went to the recovery room in stable condition.  Elonda Husky, M.D. 05/06/2023 5:23 PM

## 2023-05-06 NOTE — Anesthesia Preprocedure Evaluation (Signed)
Anesthesia Evaluation  Patient identified by MRN, date of birth, ID band Patient awake    Reviewed: Allergy & Precautions, H&P , NPO status , Patient's Chart, lab work & pertinent test results, reviewed documented beta blocker date and time   History of Anesthesia Complications Negative for: history of anesthetic complications  Airway Mallampati: II  TM Distance: >3 FB Neck ROM: full    Dental  (+) Dental Advidsory Given, Teeth Intact   Pulmonary neg shortness of breath, asthma , neg COPD, neg recent URI   Pulmonary exam normal breath sounds clear to auscultation       Cardiovascular Exercise Tolerance: Good negative cardio ROS Normal cardiovascular exam Rhythm:regular Rate:Normal     Neuro/Psych  Headaches, neg Seizures  negative psych ROS   GI/Hepatic negative GI ROS, Neg liver ROS,,,  Endo/Other  negative endocrine ROS    Renal/GU negative Renal ROS  negative genitourinary   Musculoskeletal   Abdominal   Peds  Hematology negative hematology ROS (+)   Anesthesia Other Findings Past Medical History: No date: Asthma     Comment:  as a child No date: Bacterial vaginosis 10/30/2013: Candidiasis of vulva and vagina No date: Headache(784.0) No date: Herpes genitalis 10/10/2013: Unspecified symptom associated with female genital organs 02/23/2013: Yeast infection   Reproductive/Obstetrics (+) Pregnancy ectopic                             Anesthesia Physical Anesthesia Plan  ASA: 2 and emergent  Anesthesia Plan: General   Post-op Pain Management:    Induction: Intravenous  PONV Risk Score and Plan: 3 and Ondansetron, Dexamethasone, Midazolam and Treatment may vary due to age or medical condition  Airway Management Planned: Oral ETT  Additional Equipment:   Intra-op Plan:   Post-operative Plan: Extubation in OR  Informed Consent: I have reviewed the patients History and  Physical, chart, labs and discussed the procedure including the risks, benefits and alternatives for the proposed anesthesia with the patient or authorized representative who has indicated his/her understanding and acceptance.     Dental Advisory Given  Plan Discussed with: Anesthesiologist, CRNA and Surgeon  Anesthesia Plan Comments:         Anesthesia Quick Evaluation

## 2023-05-09 ENCOUNTER — Other Ambulatory Visit: Payer: MEDICAID

## 2023-05-09 ENCOUNTER — Encounter: Payer: Self-pay | Admitting: Obstetrics and Gynecology

## 2023-05-09 ENCOUNTER — Telehealth: Payer: Self-pay

## 2023-05-09 NOTE — Telephone Encounter (Signed)
Felicia Castillo called triage line and left voicemail stating she had surgery on Friday with Dr. Logan Bores she has started bleeding Sunday morning heavy, blood clots, and dizzy.

## 2023-05-09 NOTE — Telephone Encounter (Signed)
The patient is scheduled for 1 week visit with Dr Logan Bores in office

## 2023-05-09 NOTE — Telephone Encounter (Signed)
I called Felicia Castillo and advised her to go to the ER to be evaluated due to her passing clots, bleeding heavy and stating she feels dizzy. I did advise her not to drive to please find a ride she states she will call EMS as her spouse just left for work.

## 2023-05-10 LAB — SURGICAL PATHOLOGY

## 2023-05-11 ENCOUNTER — Ambulatory Visit: Payer: MEDICAID | Admitting: Obstetrics and Gynecology

## 2023-05-11 ENCOUNTER — Encounter: Payer: Self-pay | Admitting: Obstetrics and Gynecology

## 2023-05-11 VITALS — BP 123/70 | HR 61 | Ht 67.0 in | Wt 134.5 lb

## 2023-05-11 DIAGNOSIS — Z7689 Persons encountering health services in other specified circumstances: Secondary | ICD-10-CM

## 2023-05-11 DIAGNOSIS — Z9889 Other specified postprocedural states: Secondary | ICD-10-CM

## 2023-05-11 DIAGNOSIS — O00201 Right ovarian pregnancy without intrauterine pregnancy: Secondary | ICD-10-CM

## 2023-05-11 DIAGNOSIS — Z4889 Encounter for other specified surgical aftercare: Secondary | ICD-10-CM

## 2023-05-11 NOTE — Progress Notes (Signed)
HPI:      [name] is a 32 y.o. G2P1011 who LMP was Patient's last menstrual period was 05/11/2022 (exact date).  Subjective:   She presents today 4 days postop from right salpingectomy for ectopic pregnancy.  She reports that she had some heavy bleeding over the weekend but now feels fine.  The bleeding has tapered off.  She is occasionally taking Norco but not regularly.    Hx: The following portions of the patient's history were reviewed and updated as appropriate:             She  has a past medical history of Asthma, Bacterial vaginosis, Candidiasis of vulva and vagina (10/30/2013), Headache(784.0), Herpes genitalis, Unspecified symptom associated with female genital organs (10/10/2013), and Yeast infection (02/23/2013). She does not have any pertinent problems on file. She  has a past surgical history that includes No past surgeries and laparoscopy (N/A, 05/06/2023). Her family history includes Depression in her brother and mother; Diabetes in her father; Hypertension in her father; Kidney disease in her maternal grandmother and paternal grandmother. She  reports that she has never smoked. She has never used smokeless tobacco. She reports that she does not drink alcohol and does not use drugs. She has a current medication list which includes the following prescription(s): hydrocodone-acetaminophen. She is allergic to amoxicillin.       Review of Systems:  Review of Systems  Constitutional: Denied constitutional symptoms, night sweats, recent illness, fatigue, fever, insomnia and weight loss.  Eyes: Denied eye symptoms, eye pain, photophobia, vision change and visual disturbance.  Ears/Nose/Throat/Neck: Denied ear, nose, throat or neck symptoms, hearing loss, nasal discharge, sinus congestion and sore throat.  Cardiovascular: Denied cardiovascular symptoms, arrhythmia, chest pain/pressure, edema, exercise intolerance, orthopnea and palpitations.  Respiratory: Denied pulmonary  symptoms, asthma, pleuritic pain, productive sputum, cough, dyspnea and wheezing.  Gastrointestinal: Denied, gastro-esophageal reflux, melena, nausea and vomiting.  Genitourinary: Denied genitourinary symptoms including symptomatic vaginal discharge, pelvic relaxation issues, and urinary complaints.  Musculoskeletal: Denied musculoskeletal symptoms, stiffness, swelling, muscle weakness and myalgia.  Dermatologic: Denied dermatology symptoms, rash and scar.  Neurologic: Denied neurology symptoms, dizziness, headache, neck pain and syncope.  Psychiatric: Denied psychiatric symptoms, anxiety and depression.  Endocrine: Denied endocrine symptoms including hot flashes and night sweats.   Meds:   Current Outpatient Medications on File Prior to Visit  Medication Sig Dispense Refill   HYDROcodone-acetaminophen (NORCO/VICODIN) 5-325 MG tablet Take 1-2 tablets by mouth every 6 (six) hours as needed for moderate pain (pain score 4-6). 25 tablet 0   No current facility-administered medications on file prior to visit.      Objective:     Vitals:   05/11/23 1047  BP: 123/70  Pulse: 61   Filed Weights   05/11/23 1047  Weight: 134 lb 8 oz (61 kg)               Abdomen: Soft.  Non-tender.  No masses.  No HSM.  Incision/s: Intact.  Healing well.  No erythema.  No drainage.             Assessment:    G2P1011 Patient Active Problem List   Diagnosis Date Noted   Ectopic pregnancy of right ovary 05/03/2023   Moderate dysplasia of cervix 10/30/2013   Abnormal uterine bleeding 06/07/2013   LSIL (low grade squamous intraepithelial lesion) on Pap smear 03/01/2013   Hemorrhoids 10/12/2012     1. Establishing care with new doctor, encounter for   2. Postoperative state  3. Ectopic pregnancy of right ovary     Wound care discussed.  Ectopic pregnancy and pelvic adhesions discussed in detail.  Likelihood of left clubbed tube discussed.  Future fertility discussed   Plan:             1.  Follow-up in 2 weeks for beta-hCG and postop. Orders No orders of the defined types were placed in this encounter.   No orders of the defined types were placed in this encounter.     F/U  Return in about 2 weeks (around 05/25/2023).  Elonda Husky, M.D. 05/11/2023 11:10 AM

## 2023-05-11 NOTE — Progress Notes (Signed)
Patient presents for 1 week postop follow-up following ectopic pregnancy. She states she began to bleed heavily Sunday but as of Tuesday she is still having moderate bleeding today, pain is much less than before.

## 2023-05-12 NOTE — Anesthesia Postprocedure Evaluation (Signed)
Anesthesia Post Note  Patient: Felicia Castillo  Procedure(s) Performed: LAPAROSCOPY DIAGNOSTIC  Patient location during evaluation: PACU Anesthesia Type: General Level of consciousness: awake and alert Pain management: pain level controlled Vital Signs Assessment: post-procedure vital signs reviewed and stable Respiratory status: spontaneous breathing, nonlabored ventilation, respiratory function stable and patient connected to nasal cannula oxygen Cardiovascular status: blood pressure returned to baseline and stable Postop Assessment: no apparent nausea or vomiting Anesthetic complications: no   No notable events documented.   Last Vitals:  Vitals:   05/06/23 1800 05/06/23 1826  BP: 121/77 129/85  Pulse: 68 75  Resp: 18 18  Temp:  (!) 36.2 C  SpO2: 100% 100%    Last Pain:  Vitals:   05/06/23 1826  TempSrc: Temporal  PainSc: 4                  Lenard Simmer

## 2023-05-24 ENCOUNTER — Encounter: Payer: Self-pay | Admitting: Obstetrics and Gynecology

## 2023-05-27 ENCOUNTER — Ambulatory Visit: Payer: MEDICAID | Admitting: Obstetrics and Gynecology

## 2023-05-27 DIAGNOSIS — O00201 Right ovarian pregnancy without intrauterine pregnancy: Secondary | ICD-10-CM

## 2023-05-27 DIAGNOSIS — Z9889 Other specified postprocedural states: Secondary | ICD-10-CM

## 2023-06-07 ENCOUNTER — Ambulatory Visit: Payer: MEDICAID | Admitting: Obstetrics and Gynecology

## 2023-06-07 ENCOUNTER — Telehealth: Payer: Self-pay | Admitting: Obstetrics and Gynecology

## 2023-06-07 DIAGNOSIS — Z9889 Other specified postprocedural states: Secondary | ICD-10-CM

## 2023-06-07 DIAGNOSIS — O00201 Right ovarian pregnancy without intrauterine pregnancy: Secondary | ICD-10-CM

## 2023-06-07 NOTE — Telephone Encounter (Signed)
 Reached out to pt to reschedule pp visit that was scheduled on 06/07/2023 at 10:55 with Dr. Logan Bores.  Was able to reschedule the appt for 06/15/2023 at 10:35 with Dr. Logan Bores.

## 2023-06-15 ENCOUNTER — Ambulatory Visit: Payer: MEDICAID | Admitting: Obstetrics and Gynecology

## 2023-06-15 VITALS — BP 114/75 | HR 62 | Ht 67.0 in | Wt 138.9 lb

## 2023-06-15 DIAGNOSIS — Z4889 Encounter for other specified surgical aftercare: Secondary | ICD-10-CM

## 2023-06-15 DIAGNOSIS — Z9889 Other specified postprocedural states: Secondary | ICD-10-CM

## 2023-06-15 NOTE — Progress Notes (Signed)
Patient here for six week post op diagnostic lap and right salpingectomy for ectopic pregnancy. She is doing well. No concerns at this time.

## 2023-06-15 NOTE — Progress Notes (Signed)
HPI:      Felicia Castillo is a 33 y.o. G2P1011 who LMP was Patient's last menstrual period was 06/03/2023 (approximate).  Subjective:   She presents today approximately 6 weeks postop from ectopic pregnancy and right salpingectomy.  Patient was noted to have a left clubbed tube at the time of surgery and a significant amount of pelvic adhesions. She reports that she is now doing fine.  Reports no further pelvic pain.  She has had a menstrual period since her surgery.  She is not using anything for birth control and does not desire birth control at this time. She is considering future fertility.    Hx: The following portions of the patient's history were reviewed and updated as appropriate:             She  has a past medical history of Asthma, Bacterial vaginosis, Candidiasis of vulva and vagina (10/30/2013), Headache(784.0), Herpes genitalis, Unspecified symptom associated with female genital organs (10/10/2013), and Yeast infection (02/23/2013). She does not have any pertinent problems on file. She  has a past surgical history that includes laparoscopy (N/A, 05/06/2023) and Laparoscopic unilateral salpingectomy (Right, 05/06/2023). Her family history includes Depression in her brother and mother; Diabetes in her father; Hypertension in her father; Kidney disease in her maternal grandmother and paternal grandmother. She  reports that she has never smoked. She has never used smokeless tobacco. She reports that she does not drink alcohol and does not use drugs. She has a current medication list which includes the following prescription(s): hydrocodone-acetaminophen. She is allergic to amoxicillin.       Review of Systems:  Review of Systems  Constitutional: Denied constitutional symptoms, night sweats, recent illness, fatigue, fever, insomnia and weight loss.  Eyes: Denied eye symptoms, eye pain, photophobia, vision change and visual disturbance.  Ears/Nose/Throat/Neck: Denied ear, nose,  throat or neck symptoms, hearing loss, nasal discharge, sinus congestion and sore throat.  Cardiovascular: Denied cardiovascular symptoms, arrhythmia, chest pain/pressure, edema, exercise intolerance, orthopnea and palpitations.  Respiratory: Denied pulmonary symptoms, asthma, pleuritic pain, productive sputum, cough, dyspnea and wheezing.  Gastrointestinal: Denied, gastro-esophageal reflux, melena, nausea and vomiting.  Genitourinary: Denied genitourinary symptoms including symptomatic vaginal discharge, pelvic relaxation issues, and urinary complaints.  Musculoskeletal: Denied musculoskeletal symptoms, stiffness, swelling, muscle weakness and myalgia.  Dermatologic: Denied dermatology symptoms, rash and scar.  Neurologic: Denied neurology symptoms, dizziness, headache, neck pain and syncope.  Psychiatric: Denied psychiatric symptoms, anxiety and depression.  Endocrine: Denied endocrine symptoms including hot flashes and night sweats.   Meds:   Current Outpatient Medications on File Prior to Visit  Medication Sig Dispense Refill   HYDROcodone-acetaminophen (NORCO/VICODIN) 5-325 MG tablet Take 1-2 tablets by mouth every 6 (six) hours as needed for moderate pain (pain score 4-6). (Patient not taking: Reported on 06/15/2023) 25 tablet 0   No current facility-administered medications on file prior to visit.      Objective:     Vitals:   06/15/23 1027  BP: 114/75  Pulse: 62   Filed Weights   06/15/23 1027  Weight: 138 lb 14.4 oz (63 kg)              Abdominal incisions healed well          Assessment:    G2P1011 Patient Active Problem List   Diagnosis Date Noted   Ectopic pregnancy of right ovary 05/03/2023   Moderate dysplasia of cervix 10/30/2013   Abnormal uterine bleeding 06/07/2013   LSIL (low grade squamous intraepithelial lesion) on  Pap smear 03/01/2013   Hemorrhoids 10/12/2012     1. Post-operative state     Excellent recovery postop.   Plan:            1.   We have discussed fertility in detail.  Recommendation for follow-up with infertility specialist for either repair of left clubbed fallopian tube or in vitro discussed.  Likelihood of HSG or chromopertubation at the time of surgery discussed.  Likely infertility until that time discussed.  Patient is very concerned regarding possibility of future ectopic on the left.  I have recommended birth control but she has declined at this time. She will inform us when she reaches a decision regarding fertility and if she would like referral to infertility. Orders No orders of the defined types were placed in this encounter.   No orders of the defined types were placed in this encounter.     F/U  No follow-ups on file.  Elonda Husky, M.D. 06/15/2023 11:55 AM

## 2023-06-16 ENCOUNTER — Ambulatory Visit: Payer: MEDICAID

## 2023-06-23 ENCOUNTER — Ambulatory Visit: Payer: MEDICAID

## 2023-07-05 NOTE — Progress Notes (Deleted)
 GYNECOLOGY: ANNUAL EXAM   Subjective:    PCP: Norm Salt, PA Felicia Castillo is a 33 y.o. female G2P1011 who presents for annual wellness visit.   Well Woman Visit:  GYN HISTORY:  Patient's last menstrual period was 06/03/2023 (approximate).     Menstrual History: OB History     Gravida  2   Para  1   Term  1   Preterm      AB  1   Living  1      SAB      IAB      Ectopic  1   Multiple      Live Births  1           Menarche age: *** Patient's last menstrual period was 06/03/2023 (approximate).     Periods are every *** days, and last *** days, flow is light / moderate / heavy.  Uses pads / tampons / menstrual cup and changes it every *** hours.  Cramping is mild / moderate / severe.  Cyclic symptoms include: {symptoms; gyn cyclic:13153}.  Intermenstrual bleeding, spotting, or discharge? *** Urinary incontinence? ***  Sexually active: *** Number of sexual partners: *** Gender of sexual Partners: *** Social History   Substance and Sexual Activity  Sexual Activity Not Currently   Partners: Male   Birth control/protection: None   Contraceptive methods: {PLAN CONTRACEPTION:313102} Dyspareunia? *** STI history: *** STI/HIV testing or immunizations needed? {yes no:314532}   Health Maintenance: -Last pap: {findings; last pap:13140}  --> Any abnormals: *** -Last mammogram: {mammo results:48038} --> Any abnormals? *** -Last colon cancer screen: *** / Type: *** -Last DEXA scan: *** [facility] of Breast / Colon / Cervical cancer: *** -Vaccines:  Immunization History  Administered Date(s) Administered   Hepatitis A 08/01/2006   Hepatitis B 07/13/2000, 08/10/2000, 03/01/2002, 04/10/2002, 08/21/2002, 09/13/2003   Hpv-Unspecified 08/01/2006   Influenza,inj,Quad PF,6+ Mos 04/23/2016   Tdap 04/23/2016   Last Tdap: urd / Flu: n/a / COVID: *** / Gardasil: *** / Shingles (50+): n/a / PCV20:n/a  -Hep C screen: *** -Last lipid / glucose  screening: ***  > Exercise: {misc; exercise types:16438}, {exercise level:31265} > Dietary Supplements: Folate: {yes/no:20286};  Calcium: {yes/no:20286}}; Vitamin D: {yes/no:20286} > There is no height or weight on file to calculate BMI.  > Recent dental visit {yes no:314532} > Seat Belt Use: {yes no:314532} > Texting and driving? {yes no:314532} > Guns in the house {yes no:314532} > Recreational or other drug use: {Drug Use:32241}   Social History   Tobacco Use   Smoking status: Never   Smokeless tobacco: Never  Substance Use Topics   Alcohol use: No   Occupation: ***    Lives with: ***    PHQ-2 Score: In last two weeks, how often have you felt: Little interest or pleasure in doing things: {PHQGADfrequency:29690::"Not at all (0)"} Feeling down, depressed or hopeless: {PHQGADfrequency:29690::"Not at all (0)"} Score:   GAD-2 Over the last 2 weeks, how often have you been bothered by the following problems? Feeling nervous, anxious or on edge: {PHQGADfrequency:29690::"Not at all (0)"} Not being able to stop or control worrying: {PHQGADfrequency:29690::"Not at all (0)"}} Score: _________________________________________________________  Current Outpatient Medications  Medication Sig Dispense Refill   HYDROcodone-acetaminophen (NORCO/VICODIN) 5-325 MG tablet Take 1-2 tablets by mouth every 6 (six) hours as needed for moderate pain (pain score 4-6). (Patient not taking: Reported on 06/15/2023) 25 tablet 0   No current facility-administered medications for this visit.   Allergies  Allergen  Reactions   Amoxicillin Rash    Past Medical History:  Diagnosis Date   Asthma    as a child   Bacterial vaginosis    Candidiasis of vulva and vagina 10/30/2013   Headache(784.0)    Herpes genitalis    Unspecified symptom associated with female genital organs 10/10/2013   Yeast infection 02/23/2013   Past Surgical History:  Procedure Laterality Date   LAPAROSCOPIC UNILATERAL  SALPINGECTOMY Right 05/06/2023   Ectopic Pregnancy   LAPAROSCOPY N/A 05/06/2023   Procedure: LAPAROSCOPY DIAGNOSTIC;  Surgeon: Linzie Collin, MD;  Location: [facility] ORS;  Service: Gynecology;  Laterality: N/A;    Review Of Systems  Constitutional: Denied constitutional symptoms, night sweats, recent illness, fatigue, fever, insomnia and weight loss.  Eyes: Denied eye symptoms, eye pain, photophobia, vision change and visual disturbance.  Ears/Nose/Throat/Neck: Denied ear, nose, throat or neck symptoms, hearing loss, nasal discharge, sinus congestion and sore throat.  Cardiovascular: Denied cardiovascular symptoms, arrhythmia, chest pain/pressure, edema, exercise intolerance, orthopnea and palpitations.  Respiratory: Denied pulmonary symptoms, asthma, pleuritic pain, productive sputum, cough, dyspnea and wheezing.  Gastrointestinal: Denied, gastro-esophageal reflux, melena, nausea and vomiting.  Genitourinary:*** Denied genitourinary symptoms including symptomatic vaginal discharge, pelvic relaxation issues, and urinary complaints.  Musculoskeletal: Denied musculoskeletal symptoms, stiffness, swelling, muscle weakness and myalgia.  Dermatologic: Denied dermatology symptoms, rash and scar.  Neurologic: Denied neurology symptoms, dizziness, headache, neck pain and syncope.  Psychiatric: Denied psychiatric symptoms, anxiety and depression.  Endocrine: Denied endocrine symptoms including hot flashes and night sweats.      Objective:    LMP 06/03/2023 (Approximate)   Constitutional: Well-developed, well-nourished female in no acute distress Neurological: Alert and oriented to person, place, and time Psychiatric: Mood and affect appropriate Skin: No rashes or lesions Neck: Supple without masses. Trachea is midline.Thyroid is normal size without masses Lymphatics: No cervical, axillary, supraclavicular, or inguinal adenopathy noted Respiratory: Clear to auscultation bilaterally. Good air  movement with normal work of breathing. Cardiovascular: Regular rate and rhythm. Extremities grossly normal, nontender with no edema; pulses regular Gastrointestinal: Soft, nontender, nondistended. No masses or hernias appreciated. No hepatosplenomegaly. No fluid wave. No rebound or guarding. Breast Exam: {Exam; breast:13139::"normal appearance, no masses or tenderness"} Genitourinary:         External Genitalia: Normal female genitalia    Vagina: Normal mucosa, no lesions.    Cervix: No lesions, normal size and consistency; no cervical motion tenderness; non-friable; Pap not***obtained.    Uterus: Normal size and contour; smooth, mobile, NT, {Desc; anteverted/retroverted/midposition:60613}. Adnexae: Non-palpable and non-tender Perineum/Anus: No lesions Rectal: deferred    Assessment/Plan:    Felicia Castillo is a 33 y.o. female G48P1011 with normal well-woman gynecologic exam.  -Screenings:  Pap: done with cotesting today  *** w/rflx today Mammogram: ordered***due *** Colon: ordered colonoscopy***Cologuard -OR- due *** Labs: ***A1C, CMP, HepC, Lipid panel, Vit D, TSH GAD***PHQ-2 = ***, discussed coping techniques; follow up with PCP if worsens or develops concern -Contraception: *** -Vaccines: UTD, Tdap today; pt declines Influenza. Gardasil: series not started, declined today.  -Healthy lifestyle modifications discussed: multivitamin, diet, exercise, sunscreen, tobacco and alcohol use. Emphasized importance of regular physical activity.  -Folate***Calcium and Vit D recommendation reviewed.  -All questions answered to patient's satisfaction.  -RTC 1 yr for annual, sooner prn.   No follow-ups on file.    Julieanne Manson, DO [facility] at [facility]

## 2023-07-05 NOTE — Patient Instructions (Incomplete)

## 2023-07-06 ENCOUNTER — Ambulatory Visit: Payer: MEDICAID | Admitting: Obstetrics

## 2023-07-06 DIAGNOSIS — Z113 Encounter for screening for infections with a predominantly sexual mode of transmission: Secondary | ICD-10-CM

## 2023-07-06 DIAGNOSIS — Z124 Encounter for screening for malignant neoplasm of cervix: Secondary | ICD-10-CM

## 2023-07-14 ENCOUNTER — Encounter: Payer: Self-pay | Admitting: Obstetrics

## 2023-08-15 ENCOUNTER — Ambulatory Visit: Payer: MEDICAID | Admitting: Obstetrics

## 2023-08-15 ENCOUNTER — Ambulatory Visit: Payer: MEDICAID | Admitting: Certified Nurse Midwife

## 2023-08-16 ENCOUNTER — Ambulatory Visit: Payer: MEDICAID | Admitting: Obstetrics

## 2023-08-22 ENCOUNTER — Encounter: Payer: Self-pay | Admitting: Obstetrics

## 2023-08-31 ENCOUNTER — Other Ambulatory Visit: Payer: Self-pay

## 2023-08-31 ENCOUNTER — Emergency Department: Payer: MEDICAID

## 2023-08-31 ENCOUNTER — Encounter: Payer: Self-pay | Admitting: *Deleted

## 2023-08-31 DIAGNOSIS — R110 Nausea: Secondary | ICD-10-CM | POA: Insufficient documentation

## 2023-08-31 DIAGNOSIS — R519 Headache, unspecified: Secondary | ICD-10-CM | POA: Diagnosis not present

## 2023-08-31 DIAGNOSIS — R42 Dizziness and giddiness: Secondary | ICD-10-CM | POA: Diagnosis present

## 2023-08-31 LAB — CBC
HCT: 34.4 % — ABNORMAL LOW (ref 36.0–46.0)
Hemoglobin: 10.7 g/dL — ABNORMAL LOW (ref 12.0–15.0)
MCH: 22.2 pg — ABNORMAL LOW (ref 26.0–34.0)
MCHC: 31.1 g/dL (ref 30.0–36.0)
MCV: 71.5 fL — ABNORMAL LOW (ref 80.0–100.0)
Platelets: 236 10*3/uL (ref 150–400)
RBC: 4.81 MIL/uL (ref 3.87–5.11)
RDW: 17.9 % — ABNORMAL HIGH (ref 11.5–15.5)
WBC: 4.9 10*3/uL (ref 4.0–10.5)
nRBC: 0 % (ref 0.0–0.2)

## 2023-08-31 LAB — BASIC METABOLIC PANEL WITH GFR
Anion gap: 7 (ref 5–15)
BUN: 13 mg/dL (ref 6–20)
CO2: 25 mmol/L (ref 22–32)
Calcium: 9.4 mg/dL (ref 8.9–10.3)
Chloride: 105 mmol/L (ref 98–111)
Creatinine, Ser: 0.69 mg/dL (ref 0.44–1.00)
GFR, Estimated: >60 mL/min (ref >60)
Glucose, Bld: 85 mg/dL (ref 70–99)
Potassium: 3.4 mmol/L — ABNORMAL LOW (ref 3.5–5.1)
Sodium: 137 mmol/L (ref 135–145)

## 2023-08-31 LAB — TROPONIN I (HIGH SENSITIVITY): Troponin I (High Sensitivity): <2 ng/L (ref <18)

## 2023-08-31 NOTE — ED Notes (Signed)
 No ct scan at this time per dr Larinda Buttery

## 2023-08-31 NOTE — ED Triage Notes (Addendum)
 Pt ambulatory to triage.  Pt reports feeling dizzy at work [employer].   Pt also has a headache since 10am .  Pt has nausea.   No chest pain.  Pt alert  speech clear.   Hx ectopic pregnancy

## 2023-09-01 ENCOUNTER — Emergency Department
Admission: EM | Admit: 2023-09-01 | Discharge: 2023-09-01 | Disposition: A | Discharge status: Home / Self Care | Payer: MEDICAID | Attending: Emergency Medicine | Admitting: Emergency Medicine

## 2023-09-01 DIAGNOSIS — R110 Nausea: Secondary | ICD-10-CM

## 2023-09-01 DIAGNOSIS — R519 Headache, unspecified: Secondary | ICD-10-CM

## 2023-09-01 DIAGNOSIS — R42 Dizziness and giddiness: Secondary | ICD-10-CM

## 2023-09-01 MED ORDER — ONDANSETRON 4 MG PO TBDP
4.0000 mg | ORAL_TABLET | Freq: Once | ORAL | Status: AC
  Administered 2023-09-01: 4 mg via ORAL
  Filled 2023-09-01: qty 1

## 2023-09-01 MED ORDER — KETOROLAC TROMETHAMINE 15 MG/ML IJ SOLN
30.0000 mg | Freq: Once | INTRAMUSCULAR | Status: AC
  Administered 2023-09-01: 30 mg via INTRAMUSCULAR
  Filled 2023-09-01: qty 2

## 2023-09-01 MED ORDER — ONDANSETRON 4 MG PO TBDP: 4.0000 mg | ORAL_TABLET | Freq: Three times a day (TID) | ORAL | 0 refills | Status: AC | PRN

## 2023-09-01 NOTE — ED Notes (Signed)
POC Preg test: negative

## 2023-09-01 NOTE — Discharge Instructions (Signed)
 Take acetaminophen 650 mg and ibuprofen 400 mg every 6 hours for pain.  Take with food. Take zofran for nausea as needed.  Drink plenty of fluids to stay well-hydrated.  Find Pedialyte or similar electrolyte rehydration formulas at your local pharmacy.   Thank you for choosing [place] for your health care today!  Please see your primary doctor this week for a follow up appointment.   If you have any new, worsening, or unexpected symptoms call your doctor right away or come back to the emergency department for reevaluation.  It was my pleasure to care for you today.   Daneil Dan Modesto Charon, MD

## 2023-09-01 NOTE — ED Provider Notes (Signed)
 [facility] Provider Note    Event Date/Time   First MD Initiated Contact with Patient 09/01/23 0145     (approximate)   History   Near Syncope   HPI  Felicia Castillo is a 33 y.o. female   Past medical history of headaches and previous ectopic pregnancy who presents to the Emergency Department with mild left-sided headache along with nausea and lightheadedness.  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  She did not eat much today.  She had a left-sided headache that is moderate in intensity gradually worsening throughout the day, not worst headache of her life is atraumatic and she did not have sudden onset/thunderclap headache.  She felt lightheaded when walking.  She has been nauseated has not vomited.  She denies any GI or GU complaints otherwise.    External Medical Documents Reviewed: Obstetrics/gynecology notes from December 2024 for ectopic pregnancy      Physical Exam   Triage Vital Signs: ED Triage Vitals  Encounter Vitals Group     BP 08/31/23 2245 129/84     Systolic BP Percentile --      Diastolic BP Percentile --      Pulse Rate 08/31/23 2245 77     Resp 08/31/23 2245 18     Temp 08/31/23 2245 97.8 F (36.6 C)     Temp src --      SpO2 08/31/23 2245 100 %     Weight 08/31/23 2245 135 lb (61.2 kg)     Height 08/31/23 2245 5\' 7"  (1.702 m)     Head Circumference --      Peak Flow --      Pain Score 08/31/23 2244 8     Pain Loc --      Pain Education --      Exclude from Growth Chart --     Most recent vital signs: Vitals:   09/01/23 0130 09/01/23 0316  BP: (!) 148/81 (!) 125/90  Pulse: (!) 55 (!) 49  Resp: 19 16  Temp:    SpO2: 100% 100%    General: Awake, no distress.  CV:  Good peripheral perfusion.  Resp:  Normal effort.  Abd:  No distention.  Other:  Pleasant woman in no acute distress.  Bradycardic, otherwise vital signs are normal.  Skin appears warm well-perfused.  Awake alert oriented  ambulating with steady gait.  No facial asymmetry dysarthria motor or sensory deficits.  Soft benign abdominal exam to palpation all quadrants.  Extraocular movements intact no nystagmus.  Neck supple full range of motion.   ED Results / Procedures / Treatments   Labs (all labs ordered are listed, but only abnormal results are displayed) Labs Reviewed  BASIC METABOLIC PANEL WITH GFR - Abnormal; Notable for the following components:      Result Value   Potassium 3.4 (*)    All other components within normal limits  CBC - Abnormal; Notable for the following components:   Hemoglobin 10.7 (*)    HCT 34.4 (*)    MCV 71.5 (*)    MCH 22.2 (*)    RDW 17.9 (*)    All other components within normal limits  POC URINE PREG, ED  TROPONIN I (HIGH SENSITIVITY)     I ordered and reviewed the above labs they are notable for chronic unchanged microcytic anemia.  Negative pregnancy  EKG  ED ECG REPORT I, Pilar Jarvis, the attending physician, personally viewed and interpreted this ECG.  Date: 09/01/2023  EKG Time: 2247  Rate: 58  Rhythm: sinus bradycardia  Axis: Rightward  Intervals:none  ST&T Change: no stemi    PROCEDURES:  Critical Care performed: No  Procedures   MEDICATIONS ORDERED IN ED: Medications  ketorolac (TORADOL) 15 MG/ML injection 30 mg (30 mg Intramuscular Given 09/01/23 0256)  ondansetron (ZOFRAN-ODT) disintegrating tablet 4 mg (4 mg Oral Given 09/01/23 0253)     IMPRESSION / MDM / ASSESSMENT AND PLAN / ED COURSE  I reviewed the triage vital signs and the nursing notes.                                Patient's presentation is most consistent with acute presentation with potential threat to life or bodily function.  Differential diagnosis includes, but is not limited to, migraine headache, tension type headache, cluster headache, considered but less likely stroke ICH temporal arteritis   The patient is on the cardiac monitor to evaluate for evidence of  arrhythmia and/or significant heart rate changes.  MDM:    She is a headache I think induced by her sleep irregularities due to her job, poor p.o. intake.  She has no clinical findings signs or symptoms of more emergency conditions like ICH, stroke, temporal arteritis.  Her headache has mostly self resolved without any intervention.  I will give her an antiemetic encourage p.o. intake, give Toradol for headache, write her a work note as requested.  I doubt life-threatening emergency at this time so she will be discharged.  She will follow-up with her primary doctor.  I made a referral for her for a close her primary doctor to [facility] as requested.        FINAL CLINICAL IMPRESSION(S) / ED DIAGNOSES   Final diagnoses:  Nonintractable headache, unspecified chronicity pattern, unspecified headache type  Lightheaded  Nausea     Rx / DC Orders   ED Discharge Orders          Ordered    ondansetron (ZOFRAN-ODT) 4 MG disintegrating tablet  Every 8 hours PRN        09/01/23 0240    Ambulatory Referral to Primary Care (Establish Care)        09/01/23 0241             Note:  This document was prepared using Dragon voice recognition software and may include unintentional dictation errors.    Pilar Jarvis, MD 09/01/23 [phone]

## 2023-09-05 ENCOUNTER — Telehealth: Payer: Self-pay

## 2023-09-05 NOTE — Telephone Encounter (Signed)
 Copied from CRM [id number]. Topic: Appointments - Transfer of Care >> Sep 02, 2023  4:32 PM DeAngela L wrote: Pt is requesting to transfer FROM: [facility] [address] [place] [address]  Pt is requesting to transfer TO: [facility] (1st available appointment patient has low iron and needs a new pcp) Reason for requested transfer: the commute is really far away It is the responsibility of the team the patient would like to transfer to ([facility]. [facility]) to reach out to the patient if for any reason this transfer is not acceptable. Patients number [phone]

## 2023-09-05 NOTE — Telephone Encounter (Signed)
 Pt is scheduled

## 2023-09-06 ENCOUNTER — Ambulatory Visit: Payer: MEDICAID | Admitting: Podiatry

## 2023-09-06 ENCOUNTER — Ambulatory Visit (INDEPENDENT_AMBULATORY_CARE_PROVIDER_SITE_OTHER): Payer: MEDICAID | Admitting: Podiatry

## 2023-09-06 DIAGNOSIS — B49 Unspecified mycosis: Secondary | ICD-10-CM

## 2023-09-06 DIAGNOSIS — B353 Tinea pedis: Secondary | ICD-10-CM | POA: Diagnosis not present

## 2023-09-06 MED ORDER — GENTAMICIN SULFATE 0.1 % EX OINT: 1.0000 | TOPICAL_OINTMENT | Freq: Three times a day (TID) | CUTANEOUS | 0 refills | Status: DC

## 2023-09-06 MED ORDER — CICLOPIROX 0.77 % EX GEL: 1.0000 | Freq: Two times a day (BID) | CUTANEOUS | 1 refills | Status: DC

## 2023-09-06 NOTE — Patient Instructions (Signed)
 You can use an antiperspirant spray on your feet daily  Dry well between your toes particularly after showering  Change shoes and socks regularly.  Look for moisture wicking socks  --  If was nice to meet you today. If you have any questions or any further concerns, please feel fee to give me a call. You can call our office at [phone] or please feel fee to send me a message through MyChart.   --   Athlete's Foot  Athlete's foot (tinea pedis) is a fungal infection of the skin on your feet. It often occurs on the skin that is between or underneath your toes. It can also occur on the soles of your feet. Symptoms include itchy or white and flaky areas on the skin. The infection can spread from person to person (is contagious). It can also spread when a person's bare feet come in contact with the fungus on shower floors or on items such as shoes. Follow these instructions at home: Medicines Apply or take over-the-counter and prescription medicines only as told by your doctor. Apply your antifungal medicine as told by your doctor. Do not stop using it even if your feet start to get better. Foot care Do not scratch your feet. Keep your feet dry: Wear cotton or wool socks. Change your socks every day or if they become wet. Wear shoes that allow air to move around, such as sandals or canvas tennis shoes. Wash and dry your feet: Every day or as told by your doctor. After exercising. Including the area between your toes. General instructions Do not share any of these items that touch your feet: Towels. Shoes. Nail clippers. Other personal items. Protect your feet by wearing sandals in wet areas, such as locker rooms and shared showers. Keep all follow-up visits. If you have diabetes, keep your blood sugar under control. Contact a doctor if: You have a fever. You have swelling, pain, warmth, or redness in your foot. Your feet are not getting better with treatment. Your symptoms get  worse. You have new symptoms. You have very bad pain. Summary Athlete's foot is a fungal infection of the skin on your feet. This condition is caused by a fungus that grows in warm, moist places. Symptoms include itchy or white and flaky areas on the skin. Apply your antifungal medicine as told by your doctor. Keep your feet clean and dry. This information is not intended to replace advice given to you by your health care provider. Make sure you discuss any questions you have with your health care provider. Document Revised: 08/31/2020 Document Reviewed: 08/31/2020 Elsevier Patient Education  2024 [employer].

## 2023-09-10 NOTE — Progress Notes (Signed)
  Subjective:  Patient ID: Felicia Castillo, female    DOB: 1990-10-19,  MRN: [id number]  Chief Complaint  Patient presents with   Nail Problem    RM#12 Possible fungal infection has treated in the past with topical cream and powders which was not as effective.Patient states has fungus in between the toes due to work shoes. Experiencing pain and itching at present time.    Discussed the use of AI scribe software for clinical note transcription with the patient, who gave verbal consent to proceed.  History of Present Illness The patient, with a lifelong history of foot fungal infections, presents with a recent flare-up. 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 The infection is localized to the middle toe on both feet, with some spread to adjacent toes. The patient reports that the infection causes pain when walking, particularly between and under the toes due to the thickness of the fungal growth. She also reports excessive foot sweating, which exacerbates the itching and discomfort. The patient has been using an antifungal powder for symptom management, which has been somewhat effective in controlling the spread of the infection. However, the middle toes on both feet continue to be affected. The patient also reports a history of a similar fungal infection in the toenails, which was successfully treated with medication.      Objective:    Physical Exam General: AAO x3, NAD  Dermatological: Mostly third interspaces bilaterally there is buildup of the left side worse than the right.  There is macerated tissue, callus formation on the right side is more of dry, peeling, erythematous skin.  This is consistent with tinea pedis.  Slight green discoloration along the tissue on the right foot there is no drainage or pus or any open lesions.  Vascular: Dorsalis Pedis artery and Posterior Tibial artery pedal pulses are 2/4  bilateral with immedate capillary fill time.  There is no pain with calf compression, swelling, warmth, erythema.   Neruologic: Grossly intact via light touch bilateral.   Musculoskeletal: No significant pain on exam today.   Gait: Unassisted, Nonantalgic.     No images are attached to the encounter.    Results    Assessment:   Tinea pedis  Plan:  Patient was evaluated and treated and all questions answered.  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 Symptoms include itching, pain, and green discoloration, indicating bacterial involvement. Discussed chronic nature, recurrence potential, and moisture control importance. - Obtain skin culture to identify bacteria and fungi.  Debrided the skin the left third interspace signs symptoms to [facility] for a webspace panel.  - Prescribed Cyclopirox gel for antifungal treatment. - Prescribed antibiotic cream for bacterial infection.  Gentamicin  ointment-  - Advised use of powder for moisture and itching control. - Recommended thorough drying between toes post-shower. - Suggested changing socks mid-shift and using moisture-wicking socks. - Advised antiperspirant spray on feet to reduce sweating. - Recommended lamb's wool between toes to reduce friction and absorb moisture.  Charity Conch DPM       Return for culture results.

## 2023-09-12 ENCOUNTER — Ambulatory Visit: Payer: MEDICAID | Admitting: Obstetrics

## 2023-09-20 ENCOUNTER — Other Ambulatory Visit: Payer: Self-pay | Admitting: Obstetrics

## 2023-09-20 ENCOUNTER — Other Ambulatory Visit (HOSPITAL_COMMUNITY)
Admission: RE | Admit: 2023-09-20 | Discharge: 2023-09-20 | Disposition: A | Discharge status: Home / Self Care | Payer: MEDICAID | Source: Ambulatory Visit | Attending: Obstetrics | Admitting: Obstetrics

## 2023-09-20 ENCOUNTER — Other Ambulatory Visit: Payer: Self-pay | Admitting: Podiatry

## 2023-09-20 ENCOUNTER — Ambulatory Visit (INDEPENDENT_AMBULATORY_CARE_PROVIDER_SITE_OTHER): Payer: MEDICAID | Admitting: Obstetrics

## 2023-09-20 ENCOUNTER — Telehealth: Payer: Self-pay | Admitting: Obstetrics

## 2023-09-20 ENCOUNTER — Encounter: Payer: Self-pay | Admitting: Obstetrics

## 2023-09-20 VITALS — BP 126/78 | HR 60 | Ht 67.0 in | Wt 138.0 lb

## 2023-09-20 DIAGNOSIS — Z01419 Encounter for gynecological examination (general) (routine) without abnormal findings: Secondary | ICD-10-CM

## 2023-09-20 DIAGNOSIS — N63 Unspecified lump in unspecified breast: Secondary | ICD-10-CM

## 2023-09-20 DIAGNOSIS — R3915 Urgency of urination: Secondary | ICD-10-CM | POA: Diagnosis not present

## 2023-09-20 DIAGNOSIS — Z0000 Encounter for general adult medical examination without abnormal findings: Secondary | ICD-10-CM

## 2023-09-20 DIAGNOSIS — Z113 Encounter for screening for infections with a predominantly sexual mode of transmission: Secondary | ICD-10-CM

## 2023-09-20 DIAGNOSIS — N926 Irregular menstruation, unspecified: Secondary | ICD-10-CM

## 2023-09-20 DIAGNOSIS — Z01411 Encounter for gynecological examination (general) (routine) with abnormal findings: Secondary | ICD-10-CM

## 2023-09-20 DIAGNOSIS — Z131 Encounter for screening for diabetes mellitus: Secondary | ICD-10-CM

## 2023-09-20 DIAGNOSIS — Z124 Encounter for screening for malignant neoplasm of cervix: Secondary | ICD-10-CM

## 2023-09-20 DIAGNOSIS — Z8759 Personal history of other complications of pregnancy, childbirth and the puerperium: Secondary | ICD-10-CM | POA: Diagnosis not present

## 2023-09-20 DIAGNOSIS — Z3202 Encounter for pregnancy test, result negative: Secondary | ICD-10-CM

## 2023-09-20 DIAGNOSIS — Z13220 Encounter for screening for lipoid disorders: Secondary | ICD-10-CM

## 2023-09-20 LAB — POCT URINALYSIS DIPSTICK
Bilirubin, UA: NEGATIVE
Blood, UA: NEGATIVE
Glucose, UA: NEGATIVE
Ketones, UA: NEGATIVE
Leukocytes, UA: NEGATIVE
Nitrite, UA: NEGATIVE
Protein, UA: NEGATIVE
Spec Grav, UA: 1.01 (ref 1.010–1.025)
Urobilinogen, UA: 0.2 U/dL
pH, UA: 6.5 (ref 5.0–8.0)

## 2023-09-20 LAB — POCT URINE PREGNANCY: Preg Test, Ur: NEGATIVE

## 2023-09-20 NOTE — Telephone Encounter (Signed)
 Spoke to pt she was asking to add on bv and yeast to her papsmear

## 2023-09-20 NOTE — Telephone Encounter (Signed)
 Patient called triage line states she is needing to discuss test results.

## 2023-09-20 NOTE — Addendum Note (Signed)
 Addended by: Abram Abraham on: 09/20/2023 11:09 AM   Modules accepted: Orders

## 2023-09-20 NOTE — Progress Notes (Addendum)
 ANNUAL GYNECOLOGICAL EXAM  SUBJECTIVE  HPI  Felicia Castillo is a 33 y.o.-year-old G2P1011 who presents for an annual gynecological exam today.  She denies pelvic pain, abnormal vaginal bleeding and discharge. She reports occasional dyspareunia. She also reports new-onset urinary urgency without dysuria or hematuria. She feels she has recovered well from her surgery but still has intermittent cramping/hip pain. She does not remember getting a period in March. She is currently sexually active with one female partner and is not trying for pregnancy but is not opposed to it. She reports that she has been having pain in her breasts. She would like STI testing today.  Medical/Surgical History Past Medical History:  Diagnosis Date   Asthma    as a child   Bacterial vaginosis    Candidiasis of vulva and vagina 10/30/2013   Headache(784.0)    Herpes genitalis    Unspecified symptom associated with female genital organs 10/10/2013   Yeast infection 02/23/2013   Past Surgical History:  Procedure Laterality Date   LAPAROSCOPIC UNILATERAL SALPINGECTOMY Right 05/06/2023   Ectopic Pregnancy   LAPAROSCOPY N/A 05/06/2023   Procedure: LAPAROSCOPY DIAGNOSTIC;  Surgeon: Zenobia Hila, MD;  Location: [facility] ORS;  Service: Gynecology;  Laterality: N/A;    Social History Lives with daughter and fiance. Feels safe there Work: Nursing home, warehouse Exercise: at work Substances: Denies EtOH, tobacco, vape, and recreational drugs  Obstetric History OB History     Gravida  2   Para  1   Term  1   Preterm      AB  1   Living  1      SAB      IAB      Ectopic  1   Multiple      Live Births  1            GYN/Menstrual History Patient's last menstrual period was 08/31/2023 (exact date). Regular monthly periods Last Pap: 01/11/2017. NILM. Contraception: none  Prevention Endorses regular dental and eye exams Mammogram: at 39 Colonoscopy: at 76   Current  Medications Outpatient Medications Prior to Visit  Medication Sig   Ciclopirox  0.77 % gel Apply 1 Application topically 2 (two) times daily.   gentamicin  ointment (GARAMYCIN ) 0.1 % Apply 1 Application topically 3 (three) times daily.   HYDROcodone -acetaminophen  (NORCO/VICODIN) 5-325 MG tablet Take 1-2 tablets by mouth every 6 (six) hours as needed for moderate pain (pain score 4-6). (Patient not taking: Reported on 06/15/2023)   ondansetron  (ZOFRAN -ODT) 4 MG disintegrating tablet Take 1 tablet (4 mg total) by mouth every 8 (eight) hours as needed for nausea or vomiting.   No facility-administered medications prior to visit.      ROS Constitutional: Denied constitutional symptoms, night sweats, recent illness, fatigue, fever, insomnia and weight loss.  Eyes: Denied eye symptoms, eye pain, photophobia, vision change and visual disturbance.  Ears/Nose/Throat/Neck: Denied ear, nose, throat or neck symptoms, hearing loss, nasal discharge, sinus congestion and sore throat. Difficulty swallowing.  Cardiovascular: Denied cardiovascular symptoms, arrhythmia, chest pain/pressure, edema, exercise intolerance, orthopnea and palpitations.  Respiratory: Denied pulmonary symptoms, asthma, pleuritic pain, productive sputum, cough, dyspnea and wheezing.  Gastrointestinal: Denied gastro-esophageal reflux, melena, nausea and vomiting.  Genitourinary: Denied genitourinary symptoms including symptomatic vaginal discharge, pelvic relaxation issues. +urinary urgency  Musculoskeletal: Denied musculoskeletal symptoms, stiffness, swelling, muscle weakness and myalgia.  Dermatologic: Denied dermatology symptoms, rash and scar.  Neurologic: Denied neurology symptoms, dizziness, headache, neck pain and syncope.  Psychiatric: Denied psychiatric symptoms, anxiety  and depression.  Endocrine: Denied endocrine symptoms including hot flashes and night sweats.    OBJECTIVE  LMP 08/31/2023 (Exact Date)   UPT:  negative  Physical examination General NAD, Conversant  HEENT Atraumatic; Op clear with mmm.  Normo-cephalic. Pupils reactive. Anicteric sclerae  Thyroid/Neck Smooth without nodularity. Mildly enlarged. Normal ROM.  Neck Supple.  Skin No rashes, lesions or ulceration. Normal palpated skin turgor. No nodularity.  Breasts: Left breast; 1 cm x 2 cm tender mobile mass in lower outer quadrant. Right breast: 1 cm tender mass below areola.  Lungs: Clear to auscultation.No rales or wheezes. Normal Respiratory effort, no retractions.  Heart: NSR.  No murmurs or rubs appreciated. No peripheral edema  Abdomen: Soft.  Non-tender.  No masses.  No HSM. No hernia  Extremities: Moves all appropriately.  Normal ROM for age. No lymphadenopathy.  Neuro: Oriented to PPT.  Normal mood. Normal affect.     Pelvic:   Vulva: Normal appearance.  No lesions.  Vagina: No lesions or abnormalities noted.  Support: Normal pelvic support.  Urethra No masses tenderness or scarring.  Meatus Normal size without lesions or prolapse.  Cervix: Normal appearance.  No lesions.Pap collected.  Perineum: Normal exam.  No lesions.    ASSESSMENT  1) Annual exam 2) Due for Pap 3) H/o ectopic pregnancy, pelvic adhesions 4) Breast tenderness with mass   PLAN 1) Physical exam as noted. Discussed healthy lifestyle choices and preventive care. STI testing today. Labs: A1C, CBC,CMP, lipids, TSH 2) Pap collected. F/u based on results 3) Discussed increased risk of ectopic pregnancy. Encouraged taking precautions against pregnancy at this time. She is interested is considering IUI/IVF in the future. Recommend seeking early prenatal care if she does conceive. 4) Diagnostic mammogram ordered  Return in one year for annual exam or as needed for concerns.   Thiago Ragsdale, CNM

## 2023-09-21 LAB — COMPREHENSIVE METABOLIC PANEL WITH GFR
ALT: 31 IU/L (ref 0–32)
AST: 30 IU/L (ref 0–40)
Albumin: 4.1 g/dL (ref 3.9–4.9)
Alkaline Phosphatase: 90 IU/L (ref 44–121)
BUN/Creatinine Ratio: 16 (ref 9–23)
BUN: 10 mg/dL (ref 6–20)
Bilirubin Total: 0.4 mg/dL (ref 0.0–1.2)
CO2: 23 mmol/L (ref 20–29)
Calcium: 9.4 mg/dL (ref 8.7–10.2)
Chloride: 104 mmol/L (ref 96–106)
Creatinine, Ser: 0.64 mg/dL (ref 0.57–1.00)
Globulin, Total: 3.4 g/dL (ref 1.5–4.5)
Glucose: 88 mg/dL (ref 70–99)
Potassium: 4.2 mmol/L (ref 3.5–5.2)
Sodium: 137 mmol/L (ref 134–144)
Total Protein: 7.5 g/dL (ref 6.0–8.5)
eGFR: 120 mL/min/{1.73_m2} (ref >59)

## 2023-09-21 LAB — LIPID PANEL
Chol/HDL Ratio: 2.4 ratio (ref 0.0–4.4)
Cholesterol, Total: 136 mg/dL (ref 100–199)
HDL: 57 mg/dL (ref >39)
LDL Chol Calc (NIH): 69 mg/dL (ref 0–99)
Triglycerides: 44 mg/dL (ref 0–149)
VLDL Cholesterol Cal: 10 mg/dL (ref 5–40)

## 2023-09-21 LAB — HEP, RPR, HIV PANEL
HIV Screen 4th Generation wRfx: NONREACTIVE
Hepatitis B Surface Ag: NEGATIVE
RPR Ser Ql: NONREACTIVE

## 2023-09-21 LAB — CBC
Hematocrit: 38 % (ref 34.0–46.6)
Hemoglobin: 11 g/dL — ABNORMAL LOW (ref 11.1–15.9)
MCH: 21.7 pg — ABNORMAL LOW (ref 26.6–33.0)
MCHC: 28.9 g/dL — ABNORMAL LOW (ref 31.5–35.7)
MCV: 75 fL — ABNORMAL LOW (ref 79–97)
Platelets: 258 10*3/uL (ref 150–450)
RBC: 5.07 x10E6/uL (ref 3.77–5.28)
RDW: 18.1 % — ABNORMAL HIGH (ref 11.7–15.4)
WBC: 4.4 10*3/uL (ref 3.4–10.8)

## 2023-09-21 LAB — HEMOGLOBIN A1C
Est. average glucose Bld gHb Est-mCnc: 108 mg/dL
Hgb A1c MFr Bld: 5.4 % (ref 4.8–5.6)

## 2023-09-21 LAB — TSH: TSH: 1.28 u[IU]/mL (ref 0.450–4.500)

## 2023-09-22 LAB — CYTOLOGY - PAP
Chlamydia: NEGATIVE
Comment: NEGATIVE
Comment: NEGATIVE
Comment: NEGATIVE
Comment: NEGATIVE
Comment: NEGATIVE
Comment: NORMAL
Diagnosis: UNDETERMINED — AB
HPV 16: NEGATIVE
HPV 18 / 45: NEGATIVE
High risk HPV: POSITIVE — AB
Neisseria Gonorrhea: NEGATIVE
Trichomonas: NEGATIVE

## 2023-09-23 ENCOUNTER — Encounter: Payer: Self-pay | Admitting: Obstetrics

## 2023-09-26 ENCOUNTER — Encounter: Payer: Self-pay | Admitting: Podiatry

## 2023-10-03 ENCOUNTER — Ambulatory Visit: Payer: MEDICAID | Admitting: Family Medicine

## 2023-10-05 ENCOUNTER — Telehealth: Payer: Self-pay | Admitting: Obstetrics

## 2023-10-05 NOTE — Telephone Encounter (Signed)
 Contact the patient via phone x 2. No answer and voicemail is full. We are contacting to rescheduled 6/10 with Dr Dell Fennel due to surgery. Offering 6/17 with Dr Everardo Hitch.

## 2023-10-11 ENCOUNTER — Ambulatory Visit: Payer: MEDICAID | Admitting: Podiatry

## 2023-11-01 ENCOUNTER — Encounter: Payer: MEDICAID | Admitting: Obstetrics

## 2023-11-02 ENCOUNTER — Ambulatory Visit: Payer: MEDICAID | Admitting: Family Medicine

## 2023-11-07 NOTE — Progress Notes (Deleted)
    GYNECOLOGY OFFICE COLPOSCOPY PROCEDURE NOTE  33 y.o. G2P1011 here for colposcopy for ASCUS with POSITIVE high risk HPV pap smear on 09/20/2023. Discussed role for HPV in cervical dysplasia, need for surveillance.  Patient gave informed written consent, time out was performed.  Placed in lithotomy position. Cervix viewed with speculum and colposcope after application of acetic acid.   Colposcopy adequate? {yes/no:20286}  {Findings; colposcopy:728}; corresponding biopsies obtained.  ECC specimen obtained. All specimens were labeled and sent to pathology.  Chaperone was present during entire procedure.  Patient was given post procedure instructions.  Will follow up pathology and manage accordingly; patient will be contacted with results and recommendations.  Routine preventative health maintenance measures emphasized.    Stephane Ee, MD  [facility]

## 2023-11-07 NOTE — Patient Instructions (Incomplete)
 Colposcopy  Colposcopy is a procedure to examine the lowest part of the uterus (cervix) for abnormalities or signs of disease. This procedure is done using an instrument that makes objects appear larger and provides light (colposcope). During the procedure, your health care provider may remove a tissue sample to look at under a microscope (biopsy). A biopsy may be done if any unusual cells are seen during the colposcopy. You may have a colposcopy if you have: An abnormal Pap smear, also called a Pap test. This screening test is used to check for signs of cancer or infection of the vagina, cervix, and uterus. An HPV (human papillomavirus) test and get a positive result for a type of HPV that puts you at high risk of cancer. Certain conditions or symptoms, such as: A sore, or lesion, on your cervix. Genital warts on your vulva, vagina, or cervix. Pain during sex. Vaginal bleeding, especially after sex. A growth on your cervix (cervical polyp) that needs to be removed. Let your health care provider know about: Any allergies you have, including allergies to medicines, latex, or iodine. All medicines you are taking, including vitamins, herbs, eye drops, creams, and over-the-counter medicines. Any bleeding problems you have. Any surgeries you have had. Any medical conditions you have, such as pelvic inflammatory disease (PID) or an endometrial disorder. The pattern of your menstrual cycles and the form of birth control (contraception) you use, if any. Your medical history, including any cervical treatments and how well you tolerated the procedure (if you have ever fainted). Whether you are pregnant or may be pregnant. What are the risks? Generally, this is a safe procedure. However, problems may occur, including: Infection. Symptoms of infection may include fever, bad-smelling vaginal discharge, or pelvic pain. Vaginal bleeding. Allergic reactions to medicines. Damage to nearby structures or  organs. What happens before the procedure? Medicines Ask your health care provider about: Changing or stopping your regular medicines. This is especially important if you are taking diabetes medicines or blood thinners. Taking medicines such as aspirin and ibuprofen. These medicines can thin your blood. Do not take these medicines unless your health care provider tells you to take them. Your health care provider will likely tell you to avoid taking aspirin, or medicine that contains aspirin, for 7 days before the procedure. Taking over-the-counter medicines, vitamins, herbs, and supplements. General instructions Tell your health care provider if you have your menstrual period now or will have it at the time of your procedure. A colposcopy is not normally done during your menstrual period. If you use contraception, continue to use it before your procedure. For 24 hours before the procedure: Do not use douche products or tampons. Do not use medicines, creams, or suppositories in the vagina. Do not have sex or insert anything into your vagina. Ask your health care provider what steps will be taken to prevent infection. What happens during the procedure? You will lie down on your back, with your feet in foot rests (stirrups). An instrument called a speculum will be inserted into your vagina. This will be used so your health care provider can see your cervix and the inside of your vagina. A cotton swab will be used to place a small amount of a liquid (solution) on the areas to be examined. This solution makes it easier to see abnormal cells. You may feel a slight burning during this part. The colposcope will be used to scan the cervix with a bright white light. The colposcope will be held near  your vulva and will make your vulva, vagina, and cervix look bigger so they can be seen better. If a biopsy is needed: You may be given a medicine to numb the area (local anesthetic). Surgical tools will be  used to remove mucus and cells through your vagina. You may feel mild pain while the tissue sample is removed. Bleeding may occur. A solution may be used to stop the bleeding. The tissue removed will be sent to a lab to be looked at under a microscope. The procedure may vary among health care providers and hospitals. What happens after the procedure? You may have some cramping in your abdomen. This should go away after a few minutes. It is up to you to get the results of your procedure. Ask your health care provider, or the department that is doing the procedure, when your results will be ready. Summary Colposcopy is a procedure to examine the lowest part of the uterus (cervix), for signs of disease. A biopsy may be done as part of the procedure. You may have some cramping in your abdomen. This should go away after a few minutes. It is up to you to get the results of your procedure. Ask your health care provider, or the department that is doing the procedure, when your results will be ready. This information is not intended to replace advice given to you by your health care provider. Make sure you discuss any questions you have with your health care provider. Document Revised: 10/05/2020 Document Reviewed: 10/05/2020 Elsevier Patient Education  2024 [employer].

## 2023-11-08 ENCOUNTER — Encounter: Payer: MEDICAID | Admitting: Obstetrics & Gynecology

## 2023-11-08 DIAGNOSIS — N871 Moderate cervical dysplasia: Secondary | ICD-10-CM

## 2023-11-10 ENCOUNTER — Ambulatory Visit: Payer: MEDICAID | Admitting: Podiatry

## 2023-11-24 NOTE — Progress Notes (Signed)
 Referring Provider:  Eleanor Canny, CNM  HPI:  Felicia Castillo is a 33 y.o.  G2P1011  who presents today for evaluation and management of abnormal cervical cytology.    Prior pap smears:  Date:09/20/23  Eje:Jdrld HPV: Positive, Negative 16/18/45.  Date:01/11/17  Eje:Wpof    HPV:not tested Date:04/23/16  Eje:Wpof    HPV:not tested  Date:01/07/16  Eje:Jdrld HPV:not tested Date:04/04/13   Eje:Odpo     YEC:ZWRNFEJDDPWH:  HPV/MILD DYSPLASIA/CIN1.  Date:02/23/13    Eje:Odpo     YEC:ZWRNFEJDDPWH:  HPV/MILD DYSPLASIA/CIN1.   Prior cervical / vaginal findings: thinks had a colposcopy Date:2015 Results: mild/moderate per patient  Prior cervical treatment(s):  Was supposed to come get her cryotherapy, lost to follow up  Symptoms/History:  -Abnormal vaginal discharge: no -Postmenopausal: no -Intermenstrual bleeding: no -Postcoital bleeding: no -Bleeding problems (non-gyn): no -Contraception: none -Number of current sexual partners: 1 -Number of partners in lifetime: 6 -History of a high risk partner: no -History of STDs: Herpes genitalis  -Smoking: no -Gardasil Vaccine: 1 dose in 2008      ROS:  Pertinent items are noted in HPI.  OB History  Gravida Para Term Preterm AB Living  2 1 1  1 1   SAB IAB Ectopic Multiple Live Births    1  1    # Outcome Date GA Lbr Len/2nd Weight Sex Type Anes PTL Lv  2 Ectopic 05/06/23          1 Term 03/03/08 104w0d  8 lb 1 oz (3.657 kg) F Vag-Spont None  LIV    Past Medical History:  Diagnosis Date   Asthma    as a child   Bacterial vaginosis    Candidiasis of vulva and vagina 10/30/2013   Headache(784.0)    Herpes genitalis    Unspecified symptom associated with female genital organs 10/10/2013   Yeast infection 02/23/2013    Past Surgical History:  Procedure Laterality Date   LAPAROSCOPIC UNILATERAL SALPINGECTOMY Right 05/06/2023   Ectopic Pregnancy   LAPAROSCOPY N/A 05/06/2023   Procedure: LAPAROSCOPY DIAGNOSTIC;  Surgeon: Janit Alm Agent, MD;  Location: [facility] ORS;  Service: Gynecology;  Laterality: N/A;    SOCIAL HISTORY:  Social History   Substance and Sexual Activity  Alcohol Use No    Social History   Substance and Sexual Activity  Drug Use No     Family History  Problem Relation Age of Onset   Hypertension Mother    Hypertension Father    Diabetes Father    Depression Brother    Kidney disease Paternal Grandmother     ALLERGIES:  Amoxicillin   She has a current medication list which includes the following prescription(s): ciclopirox , gentamicin  ointment, hydrocodone -acetaminophen , and ondansetron .  Physical Exam: -Vitals:  BP (!) 98/59   Pulse 65   Ht 5' 7 (1.702 m)   Wt 134 lb (60.8 kg)   LMP 11/16/2023   BMI 20.99 kg/m   PROCEDURE: Colposcopy performed with 4% acetic acid and Lugol's after informed consent obtained.  Physical Exam                            -Aceto-white Lesions Location(s): None              -Biopsy performed: None              -ECC indicated and performed: Yes.     -Satisfactory colposcopy: Yes.      -Evidence of Invasive cervical  CA :  NO  ASSESSMENT:  Felicia Castillo is a 33 y.o. G2P1011 with ASCUS and HPV-HR positive, 16/18/45 NEG on recent pap (09/20/23), here for colposcopy today, performed as above without complications.  -ECC sent to pathology -Aftercare instructions for home reviewed, si/sx of when to call/return discussed. -Gardasil vaccine: s/p 1 dose in 2008; dose #2 today; RTC in 4 mos for final dose -Will call with results   Estil Mangle, DO [facility] [place] [facility]

## 2023-11-28 ENCOUNTER — Encounter: Payer: Self-pay | Admitting: Obstetrics

## 2023-11-28 ENCOUNTER — Ambulatory Visit: Payer: MEDICAID | Admitting: Obstetrics

## 2023-11-28 ENCOUNTER — Other Ambulatory Visit (HOSPITAL_COMMUNITY)
Admission: RE | Admit: 2023-11-28 | Discharge: 2023-11-28 | Disposition: A | Discharge status: Home / Self Care | Payer: MEDICAID | Source: Ambulatory Visit | Attending: Obstetrics | Admitting: Obstetrics

## 2023-11-28 VITALS — BP 98/59 | HR 65 | Ht 67.0 in | Wt 134.0 lb

## 2023-11-28 DIAGNOSIS — R87810 Cervical high risk human papillomavirus (HPV) DNA test positive: Secondary | ICD-10-CM | POA: Diagnosis present

## 2023-11-28 DIAGNOSIS — N888 Other specified noninflammatory disorders of cervix uteri: Secondary | ICD-10-CM

## 2023-11-28 DIAGNOSIS — Z23 Encounter for immunization: Secondary | ICD-10-CM | POA: Diagnosis not present

## 2023-11-28 DIAGNOSIS — R87610 Atypical squamous cells of undetermined significance on cytologic smear of cervix (ASC-US): Secondary | ICD-10-CM | POA: Insufficient documentation

## 2023-11-28 DIAGNOSIS — Z3202 Encounter for pregnancy test, result negative: Secondary | ICD-10-CM

## 2023-11-28 LAB — POCT URINE PREGNANCY: Preg Test, Ur: NEGATIVE

## 2023-11-28 NOTE — Patient Instructions (Signed)
Human Papillomavirus (HPV) Vaccine Injection What is this medication? HUMAN PAPILLOMAVIRUS VACCINE (HYOO muhn pap uh LOH muh vahy ruhs vak SEEN) reduces the risk of human papillomavirus (HPV). It does not treat HPV. It is still possible to get HPV after receiving this vaccine, but the symptoms may be less severe or not last as long. It works by helping your immune system learn how to fight off a future infection. This medicine may be used for other purposes; ask your health care provider or pharmacist if you have questions. COMMON BRAND NAME(S): Gardasil 9 What should I tell my care team before I take this medication? They need to know if you have any of these conditions: Fever Hemophilia HIV or AIDS Immune system problems Infection Low platelets An unusual reaction to human papillomavirus vaccine, yeast, other vaccines, other medications, foods, dyes, or preservatives Pregnant or trying to get pregnant Breastfeeding How should I use this medication? This vaccine is injected into a muscle. It is given by your care team. This vaccine requires 2 or 3 doses to get the full benefit. Set a reminder for when your next dose is due. A copy of the Vaccine Information Statement will be given before each vaccination. Be sure to read this information carefully each time. This sheet may change often. Talk to your care team about the use of this medication in children. While it may be prescribed for children as young as 9 years for selected conditions, precautions do apply. Overdosage: If you think you have taken too much of this medicine contact a poison control center or emergency room at once. NOTE: This medicine is only for you. Do not share this medicine with others. What if I miss a dose? Keep appointments for follow-up doses as directed. It is important not to miss your dose. Call your care team if you are unable to keep an appointment. What may interact with this medication? Certain medications  for arthritis Medications for organ transplant Medications to treat cancer Steroid medications, such as prednisone or cortisone This list may not describe all possible interactions. Give your health care provider a list of all the medicines, herbs, non-prescription drugs, or dietary supplements you use. Also tell them if you smoke, drink alcohol, or use illegal drugs. Some items may interact with your medicine. What should I watch for while using this medication? Visit your care team regularly. Report any side effects to your care team right away. This vaccine, like all vaccines, may not fully protect everyone. What side effects may I notice from receiving this medication? Side effects that you should report to your care team as soon as possible: Allergic reactions--skin rash, itching, hives, swelling of the face, lips, tongue, or throat Feeling faint or lightheaded Side effects that usually do not require medical attention (report these to your care team if they continue or are bothersome): Diarrhea Dizziness Fatigue Fever Headache Nausea Pain, redness, irritation, or bruising at the injection site This list may not describe all possible side effects. Call your doctor for medical advice about side effects. You may report side effects to FDA at 1-800-FDA-1088. Where should I keep my medication? This vaccine is only given by your care team. It will not be stored at home. NOTE: This sheet is a summary. It may not cover all possible information. If you have questions about this medicine, talk to your doctor, pharmacist, or health care provider.  2024 Elsevier/Gold Standard (2021-10-21 00:00:00)

## 2023-11-30 LAB — SURGICAL PATHOLOGY

## 2023-12-05 ENCOUNTER — Ambulatory Visit: Payer: Self-pay | Admitting: Obstetrics

## 2024-01-02 ENCOUNTER — Ambulatory Visit: Payer: MEDICAID | Admitting: Family Medicine

## 2024-01-25 ENCOUNTER — Ambulatory Visit: Payer: MEDICAID | Admitting: Family Medicine

## 2024-02-08 ENCOUNTER — Emergency Department (HOSPITAL_COMMUNITY)
Admission: EM | Admit: 2024-02-08 | Discharge: 2024-02-08 | Disposition: A | Discharge status: Home / Self Care | Payer: MEDICAID | Attending: Emergency Medicine | Admitting: Emergency Medicine

## 2024-02-08 ENCOUNTER — Encounter (HOSPITAL_COMMUNITY): Payer: Self-pay | Admitting: *Deleted

## 2024-02-08 ENCOUNTER — Other Ambulatory Visit: Payer: Self-pay

## 2024-02-08 DIAGNOSIS — K0889 Other specified disorders of teeth and supporting structures: Secondary | ICD-10-CM | POA: Insufficient documentation

## 2024-02-08 MED ORDER — CLINDAMYCIN HCL 300 MG PO CAPS
300.0000 mg | ORAL_CAPSULE | Freq: Three times a day (TID) | ORAL | 0 refills | Status: AC
Start: 2024-02-08 — End: 2024-02-15

## 2024-02-08 NOTE — ED Triage Notes (Signed)
 Pt started having left sided dental pain, sudden sharp shooting pain from lower face into jaw

## 2024-02-08 NOTE — ED Provider Notes (Signed)
 [place] EMERGENCY DEPARTMENT AT [facility] Provider Note   CSN: [id number] Arrival date & time: 02/08/24  1925     Patient presents with: Dental Pain   Felicia Castillo is a 33 y.o. female.   33 yo female with left lower dental pain onset 1 hour ago. Has not taken anything for pain. No trauma, fevers, drainage.       Prior to Admission medications   Medication Sig Start Date End Date Taking? Authorizing Provider  clindamycin  (CLEOCIN ) 300 MG capsule Take 1 capsule (300 mg total) by mouth 3 (three) times daily for 7 days. 02/08/24 02/15/24 Yes Beverley Leita LABOR, PA-C  Ciclopirox  0.77 % gel Apply 1 Application topically 2 (two) times daily. Patient not taking: Reported on 11/28/2023 09/06/23   Gershon Donnice SAUNDERS, DPM  gentamicin  ointment (GARAMYCIN ) 0.1 % Apply 1 Application topically 3 (three) times daily. Patient not taking: Reported on 11/28/2023 09/06/23   Gershon Donnice SAUNDERS, DPM  HYDROcodone -acetaminophen  (NORCO/VICODIN) 5-325 MG tablet Take 1-2 tablets by mouth every 6 (six) hours as needed for moderate pain (pain score 4-6). Patient not taking: Reported on 11/28/2023 05/06/23   Janit Alm Agent, MD  ondansetron  (ZOFRAN -ODT) 4 MG disintegrating tablet Take 1 tablet (4 mg total) by mouth every 8 (eight) hours as needed for nausea or vomiting. Patient not taking: Reported on 11/28/2023 09/01/23   Cyrena Mylar, MD    Allergies: Amoxicillin     Review of Systems Negative except as per HPI Updated Vital Signs BP 122/77 (BP Location: Right Arm)   Pulse 60   Temp 98.9 F (37.2 C)   Resp 18   LMP 02/05/2024   SpO2 100%   Physical Exam Vitals and nursing note reviewed.  Constitutional:      General: She is not in acute distress.    Appearance: She is well-developed. She is not diaphoretic.  HENT:     Head: Normocephalic and atraumatic.     Jaw: No trismus or swelling.     Mouth/Throat:     Mouth: Mucous membranes are moist.     Dentition: Normal dentition. Does not  have dentures. No dental tenderness, gingival swelling, dental caries, dental abscesses or gum lesions.     Pharynx: No oropharyngeal exudate or posterior oropharyngeal erythema.  Pulmonary:     Effort: Pulmonary effort is normal.  Neurological:     Mental Status: She is alert and oriented to person, place, and time.  Psychiatric:        Behavior: Behavior normal.     (all labs ordered are listed, but only abnormal results are displayed) Labs Reviewed - No data to display  EKG: None  Radiology: No results found.   Procedures   Medications Ordered in the ED - No data to display                                  Medical Decision Making Risk Prescription drug management.   33 year old female with left-sided dental pain which has since resolved.  No obvious dental source for her pain.  She does have a few fillings, advised to follow-up with dentist as there may be something developing deep to the feeling of unable to visualize today.  Should pain return, can take antibiotics as prescribed.     Final diagnoses:  Pain, dental    ED Discharge Orders          Ordered  clindamycin  (CLEOCIN ) 300 MG capsule  3 times daily        02/08/24 2003               Beverley Leita DELENA DEVONNA 02/08/24 LOVELL Armenta Canning, MD 02/12/24 [phone]

## 2024-02-08 NOTE — Discharge Instructions (Signed)
 Follow up with a dentist as soon as possible for recheck. Take Clindamycin  if pain returns and complete the full course.

## 2024-02-13 ENCOUNTER — Ambulatory Visit: Payer: MEDICAID | Admitting: Family Medicine

## 2024-03-29 NOTE — Progress Notes (Deleted)
    NURSE VISIT NOTE  Subjective:    Patient ID: BRITNY RIEL, female    DOB: 12/12/1990, 33 y.o.   MRN: [id number]  HPI  Patient is a 33 y.o. G74P1011 female Significant Other {Race/ethnicity:17218} female who presents for her {FIRST SECOND THIRD:18671} Gardasil injection. Order to administer given by {AOB Providers:28529} on ***.   Objective:    There were no vitals taken for this visit.  33 y.o. LMP:  ***  Contraception:  {CCO Contraception:21020264} Given by: {AOB Clinical Dujqq:71459} Site:  {left/right:311354} deltoid  Lab Review  No results found for any visits on 03/30/24.    Assessment:   1. Need for HPV vaccination      Plan:   Patient will return in {Gardasil Return Visit:28539} for {FIRST SECOND THIRD:18671} injection.    Mathis LITTIE Getting, CMA

## 2024-03-30 ENCOUNTER — Ambulatory Visit: Payer: MEDICAID

## 2024-04-04 NOTE — Patient Instructions (Incomplete)
Human Papillomavirus (HPV) Vaccine Injection What is this medication? HUMAN PAPILLOMAVIRUS VACCINE (HYOO muhn pap uh LOH muh vahy ruhs vak SEEN) reduces the risk of human papillomavirus (HPV). It does not treat HPV. It is still possible to get HPV after receiving this vaccine, but the symptoms may be less severe or not last as long. It works by helping your immune system learn how to fight off a future infection. This medicine may be used for other purposes; ask your health care provider or pharmacist if you have questions. COMMON BRAND NAME(S): Gardasil 9 What should I tell my care team before I take this medication? They need to know if you have any of these conditions: Fever Hemophilia HIV or AIDS Immune system problems Infection Low platelets An unusual reaction to human papillomavirus vaccine, yeast, other vaccines, other medications, foods, dyes, or preservatives Pregnant or trying to get pregnant Breastfeeding How should I use this medication? This vaccine is injected into a muscle. It is given by your care team. This vaccine requires 2 or 3 doses to get the full benefit. Set a reminder for when your next dose is due. A copy of the Vaccine Information Statement will be given before each vaccination. Be sure to read this information carefully each time. This sheet may change often. Talk to your care team about the use of this medication in children. While it may be prescribed for children as young as 9 years for selected conditions, precautions do apply. Overdosage: If you think you have taken too much of this medicine contact a poison control center or emergency room at once. NOTE: This medicine is only for you. Do not share this medicine with others. What if I miss a dose? Keep appointments for follow-up doses as directed. It is important not to miss your dose. Call your care team if you are unable to keep an appointment. What may interact with this medication? Certain medications  for arthritis Medications for organ transplant Medications to treat cancer Steroid medications, such as prednisone or cortisone This list may not describe all possible interactions. Give your health care provider a list of all the medicines, herbs, non-prescription drugs, or dietary supplements you use. Also tell them if you smoke, drink alcohol, or use illegal drugs. Some items may interact with your medicine. What should I watch for while using this medication? Visit your care team regularly. Report any side effects to your care team right away. This vaccine, like all vaccines, may not fully protect everyone. What side effects may I notice from receiving this medication? Side effects that you should report to your care team as soon as possible: Allergic reactions--skin rash, itching, hives, swelling of the face, lips, tongue, or throat Feeling faint or lightheaded Side effects that usually do not require medical attention (report these to your care team if they continue or are bothersome): Diarrhea Dizziness Fatigue Fever Headache Nausea Pain, redness, irritation, or bruising at the injection site This list may not describe all possible side effects. Call your doctor for medical advice about side effects. You may report side effects to FDA at 1-800-FDA-1088. Where should I keep my medication? This vaccine is only given by your care team. It will not be stored at home. NOTE: This sheet is a summary. It may not cover all possible information. If you have questions about this medicine, talk to your doctor, pharmacist, or health care provider.  2024 Elsevier/Gold Standard (2021-10-21 00:00:00)

## 2024-04-04 NOTE — Progress Notes (Deleted)
    NURSE VISIT NOTE  Subjective:    Patient ID: Felicia Castillo, female    DOB: July 29, 1990, 33 y.o.   MRN: [id number]  HPI  Patient is a 33 y.o. G32P1011 female Significant Other African American female who presents for her third Gardasil injection. Order to administer given by Estil Mangle, MD on 11/28/23.   Objective:    There were no vitals taken for this visit.  33 y.o. LMP:  ***  Contraception:  {CCO Contraception:21020264} Given by: {AOB Clinical Dujqq:71459} Site:  {left/right:311354} deltoid  Lab Review  No results found for any visits on 04/05/24.    Assessment:   No diagnosis found.   Plan:   Patient will return in prn. Gardasil series complete.    Mathis LITTIE Getting, CMA

## 2024-04-05 ENCOUNTER — Ambulatory Visit: Payer: MEDICAID

## 2024-04-05 DIAGNOSIS — Z23 Encounter for immunization: Secondary | ICD-10-CM
